# Patient Record
Sex: Male | Born: 1960
Health system: Southern US, Community
[De-identification: ages and names within clinical notes are randomized; demographics above are authoritative.]

## PROBLEM LIST (undated history)

## (undated) DIAGNOSIS — K439 Ventral hernia without obstruction or gangrene: Secondary | ICD-10-CM

## (undated) DIAGNOSIS — E785 Hyperlipidemia, unspecified: Secondary | ICD-10-CM

## (undated) DIAGNOSIS — I10 Essential (primary) hypertension: Secondary | ICD-10-CM

## (undated) DIAGNOSIS — F329 Major depressive disorder, single episode, unspecified: Secondary | ICD-10-CM

## (undated) DIAGNOSIS — F32A Depression, unspecified: Secondary | ICD-10-CM

## (undated) DIAGNOSIS — K219 Gastro-esophageal reflux disease without esophagitis: Secondary | ICD-10-CM

## (undated) DIAGNOSIS — G4733 Obstructive sleep apnea (adult) (pediatric): Secondary | ICD-10-CM

## (undated) DIAGNOSIS — E119 Type 2 diabetes mellitus without complications: Secondary | ICD-10-CM

## (undated) DIAGNOSIS — K2270 Barrett's esophagus without dysplasia: Secondary | ICD-10-CM

## (undated) DIAGNOSIS — E669 Obesity, unspecified: Secondary | ICD-10-CM

## (undated) HISTORY — DX: Gastro-esophageal reflux disease without esophagitis: K21.9

## (undated) HISTORY — DX: Type 2 diabetes mellitus without complications: E11.9

## (undated) HISTORY — DX: Major depressive disorder, single episode, unspecified: F32.9

## (undated) HISTORY — DX: Ventral hernia without obstruction or gangrene: K43.9

## (undated) HISTORY — DX: Barrett's esophagus without dysplasia: K22.70

## (undated) HISTORY — DX: Essential (primary) hypertension: I10

## (undated) HISTORY — DX: Obstructive sleep apnea (adult) (pediatric): G47.33

## (undated) HISTORY — PX: ESOPHAGOGASTRODUODENOSCOPY: SHX1529

## (undated) HISTORY — DX: Obesity, unspecified: E66.9

## (undated) HISTORY — DX: Depression, unspecified: F32.A

## (undated) HISTORY — DX: Hyperlipidemia, unspecified: E78.5

---

## 2003-02-17 ENCOUNTER — Emergency Department (HOSPITAL_COMMUNITY): Admission: EM | Admit: 2003-02-17 | Discharge: 2003-02-17 | Payer: Self-pay | Admitting: *Deleted

## 2005-07-27 ENCOUNTER — Ambulatory Visit (HOSPITAL_COMMUNITY): Admission: RE | Admit: 2005-07-27 | Discharge: 2005-07-27 | Payer: Self-pay | Admitting: Family Medicine

## 2007-12-20 ENCOUNTER — Ambulatory Visit: Admission: RE | Admit: 2007-12-20 | Discharge: 2007-12-20 | Payer: Self-pay | Admitting: Internal Medicine

## 2009-02-25 DIAGNOSIS — R0602 Shortness of breath: Secondary | ICD-10-CM | POA: Insufficient documentation

## 2009-02-25 DIAGNOSIS — G4733 Obstructive sleep apnea (adult) (pediatric): Secondary | ICD-10-CM | POA: Insufficient documentation

## 2009-02-25 DIAGNOSIS — E669 Obesity, unspecified: Secondary | ICD-10-CM | POA: Insufficient documentation

## 2009-02-25 DIAGNOSIS — K439 Ventral hernia without obstruction or gangrene: Secondary | ICD-10-CM | POA: Insufficient documentation

## 2009-02-25 DIAGNOSIS — E782 Mixed hyperlipidemia: Secondary | ICD-10-CM | POA: Insufficient documentation

## 2009-02-26 ENCOUNTER — Encounter: Payer: Self-pay | Admitting: Cardiology

## 2009-02-26 ENCOUNTER — Ambulatory Visit: Payer: Self-pay | Admitting: Cardiology

## 2009-03-22 ENCOUNTER — Ambulatory Visit: Payer: Self-pay | Admitting: Cardiology

## 2009-03-22 ENCOUNTER — Encounter (HOSPITAL_COMMUNITY): Admission: RE | Admit: 2009-03-22 | Discharge: 2009-04-21 | Payer: Self-pay | Admitting: Cardiology

## 2009-03-24 ENCOUNTER — Encounter: Payer: Self-pay | Admitting: Cardiology

## 2010-04-18 ENCOUNTER — Ambulatory Visit: Payer: Self-pay | Admitting: Orthopedic Surgery

## 2010-04-18 DIAGNOSIS — M2340 Loose body in knee, unspecified knee: Secondary | ICD-10-CM | POA: Insufficient documentation

## 2010-04-18 DIAGNOSIS — M25569 Pain in unspecified knee: Secondary | ICD-10-CM | POA: Insufficient documentation

## 2010-04-18 DIAGNOSIS — M2240 Chondromalacia patellae, unspecified knee: Secondary | ICD-10-CM | POA: Insufficient documentation

## 2010-04-26 ENCOUNTER — Encounter: Payer: Self-pay | Admitting: Orthopedic Surgery

## 2010-04-27 ENCOUNTER — Encounter: Payer: Self-pay | Admitting: Orthopedic Surgery

## 2010-04-29 ENCOUNTER — Ambulatory Visit (HOSPITAL_COMMUNITY): Admission: RE | Admit: 2010-04-29 | Discharge: 2010-04-29 | Payer: Self-pay | Admitting: Orthopedic Surgery

## 2010-09-20 NOTE — Letter (Signed)
Summary: Insurance MRI authorization  Insurance MRI authorization   Imported By: Jacklynn Ganong 04/27/2010 15:09:22  _____________________________________________________________________  External Attachment:    Type:   Image     Comment:   External Document

## 2010-09-20 NOTE — Letter (Signed)
Summary: MRI insurance approval  MRI insurance approval   Imported By: Jacklynn Ganong 04/28/2010 15:56:32  _____________________________________________________________________  External Attachment:    Type:   Image     Comment:   External Document

## 2010-09-20 NOTE — Assessment & Plan Note (Signed)
Summary: RT KNEE PAIN/NEEDS XRAY/BCBS OUT OF ST/TRICARE/CAF   Vital Signs:  Patient profile:   50 year old male Height:      71 inches Weight:      278 pounds Pulse rate:   78 / minute Resp:     16 per minute  Vitals Entered By: Fuller Canada MD (April 18, 2010 10:38 AM)  Visit Type:  Initial Consult Referring Provider:  Dr. Hughie Closs Primary Provider:  Dr. Hughie Closs  CC:  right knee pain.  History of Present Illness: 50 year old male with history of intermittent mild adult pain in the RIGHT knee for the last 6 months.  Pain seems to be behind the kneecap.  No catching or locking has been noted and there is no swelling although he does have some stiffness and weakness.  The pain is described as dull and worse after prolonged standing especially at the end of the day;  after 6-7 hours feels like a pebble under the knee cap and the knee gives out   back pain lower at times      Meds: Aciphex, Aspirin, Diovan HTZ, Cialis, Lovaza, Axiron, Simcor, Zyrtec, Zoloft, Mobic daily, Nasonex     Allergies (verified): No Known Drug Allergies  Past History:  Past Medical History: Current Problems:  HYPERLIPIDEMIA (ICD-272.4) SHORTNESS OF BREATH (ICD-786.05) OBESITY (ICD-278.00) VENTRAL HERNIA (ICD-553.20) OBSTRUCTIVE SLEEP APNEA (ICD-327.23) GERD low testosterone HTN  Past Surgical History: none  Family History: Family History of Diabetes  Social History: Patient is married.  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 Cardiovascular:  Complains of chest pain; denies palpitations, fainting, and murmurs. Respiratory:  Complains of short of breath and snoring; denies wheezing, couch, tightness, pain on inspiration, and snoring . Gastrointestinal:  Complains of heartburn; denies nausea, vomiting, diarrhea,  constipation, and blood in your stools. Genitourinary:  Denies frequency, urgency, difficulty urinating, painful urination, flank pain, and bleeding in urine. Neurologic:  Denies numbness, tingling, unsteady gait, dizziness, tremors, and seizure. Musculoskeletal:  Complains of joint pain and instability; denies swelling, stiffness, redness, heat, and muscle pain. Endocrine:  Denies excessive thirst, exessive urination, and heat or cold intolerance. Psychiatric:  Complains of depression; denies nervousness, anxiety, and hallucinations. Skin:  Denies changes in the skin, poor healing, rash, itching, and redness. HEENT:  Denies blurred or double vision, eye pain, redness, and watering. Immunology:  Complains of seasonal allergies; denies sinus problems and allergic to bee stings. Hemoatologic:  Denies easy bleeding and brusing.  Physical Exam  Additional Exam:  vitals are as recorded patient weighs 278 pounds he is 5 foot 11 his appearance was normal  He was oriented to time person and place he had a normal mood and affect.  His gait and station were normal.  His exam of both knees was not very impressive overall.  He has some medial joint line tenderness on the RIGHT none on the LEFT he had full range of motion on both knees had normal strength in both knees and the skin over both knees were normal  Pulse and temperature were normal in both legs without edema  View no lymphadenopathy in either groin sensation in both legs was normal and reflexes were equal.  Coordination bowels were normal.  His McMurray sign was equivocal.  The screw home  test was equivocal.   Impression & Recommendations:  Problem # 1:  LOOSE BODY-KNEE (ICD-717.6)  Orders: Consultation Level III ([address]) Knee x-ray,  3 views (60454) x-rays show mild arthritis in the RIGHT knee I do not see any loose body.  Recommend MRI based on the following pain has been present for 6 months, feeling of giving out it is noted.   Meniscal signs equivocal but history Sean for loose body.  Problem # 2:  KNEE PAIN (ICD-719.46)  His updated medication list for this problem includes:    Ecotrin 325 Mg Tbec (Aspirin) .[name] Kitchen... 1 tab once daily    Meloxicam 7.5 Mg Tabs (Meloxicam) .[name] Kitchen... As needed  Orders: Consultation Level III ([address]) Knee x-ray,  3 views (91478)  Patient Instructions: 1)  MRI right knee 2)  call with results

## 2010-09-20 NOTE — Letter (Signed)
Summary: History form  History form   Imported By: Jacklynn Ganong 04/28/2010 15:55:07  _____________________________________________________________________  External Attachment:    Type:   Image     Comment:   External Document

## 2010-09-20 NOTE — Letter (Signed)
Summary: *Orthopedic Consult Note  [facility]  [address] [place] [address] [address]  [place], [place] [address]   Phone: [phone]  Fax: [phone]    Re:    Derek Marsh DOB:    May 23, 1961   Dear: Timothy Lasso    Thank you for requesting that we see the above patient for consultation.  A copy of the detailed office note will be sent under separate cover, for your review.  Evaluation today is consistent with:  RIGHT knee pain withNo specific area of discomfort.  He does feel occasional pain and a light feeling behind his RIGHT knee After standing.  His pain however is more in the back of the knee after standing for 6-7 hours.  He does have some lower back pain which she is controlled with activity modification.  Our recommendation is for MRI to rule out loose body as a cause of discomfort in the knee and giving out symptoms.  The differential diagnoses however includes degenerative disc disease with dynamic leg weakness as a cause of giving out symptoms.   Thank you for this opportunity to look after your patient.  Sincerely,   Terrance Mass. MD.

## 2010-09-20 NOTE — Miscellaneous (Signed)
Summary: mri appt [facility] 04/29/10 reg at 12:30p  CALL WITH RESULTS  Clinical Lists Changes  precert for BCBS [id number] expires 07/25/10, Dr will call pt with results

## 2010-10-31 ENCOUNTER — Other Ambulatory Visit (HOSPITAL_COMMUNITY): Payer: Self-pay | Admitting: Internal Medicine

## 2010-10-31 ENCOUNTER — Ambulatory Visit (HOSPITAL_COMMUNITY)
Admission: RE | Admit: 2010-10-31 | Discharge: 2010-10-31 | Disposition: A | Discharge status: Home / Self Care | Payer: BC Managed Care – PPO | Source: Ambulatory Visit | Attending: Internal Medicine | Admitting: Internal Medicine

## 2010-10-31 DIAGNOSIS — R519 Headache, unspecified: Secondary | ICD-10-CM

## 2010-10-31 DIAGNOSIS — R51 Headache: Secondary | ICD-10-CM | POA: Insufficient documentation

## 2011-01-03 NOTE — Assessment & Plan Note (Signed)
[facility]                       [place] [facility]IOLOG[facility] OFFICE NOTE   SHAHEED, SCHMUCK                        MRN:          [id number]  DATE:02/26/2009                            DOB:          09-18-1960    I was asked by Catalina Pizza to consult on Derek Marsh with a chief  complaint of chest tightness and desire to get into an exercise program.   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  He quit smoking a  couple of years ago and has gained 40 pounds and really wants to get  back into an exercise program.  He seems very committed to this.   He does occasionally have chest tightness that is well localized to the  mid sternum with stress.  When he is on the job site and climbing steps  or doing other exertional activity, he does not have chest tightness.  He does have some mild dyspnea on exertion, however.   He has a family history of coronary disease with his father having a  stent at age 21.  His father smoked, however.   Derek Marsh does have hyperlipidemia and is being treated with Simcor.  He  also smoked up until 2 years ago.  There is no history of hypertension  or diabetes.   His medications are currently:  1. Lovaza 1 g t.i.d.  2. Ecotrin 325 a day.  3. Nasonex 50 mcg 2 sprays in each nostril daily.  4. Allergy relief 10 mg daily.  5. Simcor 1000/20 mg at bedtime.  6. Aciphex 20 mg a day.  7. AndroGel pump.  8. He takes Viagra and meloxicam p.r.n.   He has no known drug allergies.   He does not use alcohol or drugs.  He has quit smoking.   SOCIAL HISTORY:  He works for the [employer].  He is currently working in [place], building a hospital.  He is  married and has 3 children.   Family history is outlined above.  As I told him today, his father did  have premature coronary disease and did smoke.   REVIEW OF SYSTEMS:  He  has occasional headaches.  He wears glasses.  His  teeth are in good shape with no cavities or dentures.  He does have a  history of gastroesophageal reflux, and he has some occasional swelling  in his feet and his legs.  He denies any orthopnea or PND, otherwise.   PHYSICAL EXAMINATION:  GENERAL:  He is a very pleasant gentleman in no  acute distress.  VITAL SIGNS:  He is 5 feet 10 inches, weighs 288.  His blood pressure is  137/87, his pulse is 90 and regular.  His EKG shows sinus rhythm with  nonspecific changes.  HEENT:  Normal.  NECK:  Supple.  Carotid upstrokes were equal bilaterally without bruits.  Thyroid is not enlarged.  Trachea is midline.  No lymphadenopathy.  CHEST:  Lungs to be clear to auscultation and percussion.  Heart reveals  a poorly appreciated PMI.  He is a very muscular male.  Soft S1 and S2.  S2 splits.  ABDOMEN:  Slightly protuberant.  No midline or flank bruit.  No obvious  organomegaly or tenderness.  EXTREMITIES:  There were no cyanosis, clubbing, or edema.  Pulses are  intact.  NEUROLOGIC:  Intact.  MUSCULOSKELETAL:  Intact.  SKIN:  Intact and normal.   ASSESSMENT:  1. Dyspnea on exertion with multiple cardiac risk factors, rule out      obstructive coronary artery disease.  2. Chest tightness with stress which is probably not cardiac.  3. Hyperlipidemia.  4. Remote tobacco.  5. Family history though not premature of coronary disease in his      family.   PLAN:  Exercise rest stress Myoview.  If this is negative for ischemia.  He will be released to exercise 3 hours per week or more.  I have made  it clear to him he will not lose weight unless he change his diet with  exercise alone.  He understands this concept.     Thomas C. Daleen Squibb, MD, [facility]  Electronically Signed    TCW/MedQ  DD: 02/26/2009  DT: 02/27/2009  Job #: [id number]   cc:   Catalina Pizza, M.D.

## 2011-01-06 NOTE — Procedures (Signed)
NAMEDAMEON, SOLTIS                 ACCOUNT NO.:  [id number]   MEDICAL RECORD NO.:  [id number]          PATIENT TYPE:  OUT   LOCATION:  SLEE                          FACILITY:  [facility]   PHYSICIAN:  Kofi A. Gerilyn Pilgrim, M.D. DATE OF BIRTH:  June 30, 1961   DATE OF PROCEDURE:  DATE OF DISCHARGE:  12/20/2007                             SLEEP DISORDER REPORT   NOCTURNAL POLYSOMNOGRAPHY REPORT.   REFERRING PHYSICIAN:   INDICATION:  This is 50 year old who presents with hypersomnia and  snoring, and is being evaluated for obstructive sleep apnea syndrome.  BMI 36 and Epworth sleepiness scale of 12.   MEDICATIONS:  1. Zyrtec.  2. Aciphex.  3. Mobic.  4. Wellbutrin.  5. Aspirin.  6. Testosterone.  7. Simcor.  8. Lovaza.   SLEEP STAGE SUMMARY:  The total recording time is 450 minutes.  The  overall sleep efficiency is 79%, sleep latency 34 minutes, and REM  latency 260 minutes.  This was a split night study.   RESPIRATORY SUMMARY:  This was a split night study with the initial part  being diagnostic potion and the second half being titration portion.  The baseline oxygen saturation is 95%, the lowest oxygen saturation is  89%, and the diagnostic AHI is 20.  The patient subsequently titrated  between a pressure of 5 and 10 with an optimal pressure of 9.  He  appears to have developed central events on a pressure of 10.  He  tolerated the CPAP well.   LEG MOVEMENT SUMMARY:  No nocturnal leg movements observed.   ELECTROCARDIOGRAM SUMMARY:  Average heart rate at 67% with isolated PVCs  observed.   IMPRESSION:  Moderate obstructive sleep apnea syndrome which responded  well to a continuous positive airway pressure of 9.   Thanks for this referral.      Kofi A. Gerilyn Pilgrim, M.D.  Electronically Signed     KAD/MEDQ  D:  12/28/2007  T:  12/29/2007  Job:  [id number]

## 2011-07-17 ENCOUNTER — Encounter: Payer: Self-pay | Admitting: Cardiology

## 2011-10-19 ENCOUNTER — Telehealth: Payer: Self-pay

## 2011-10-19 ENCOUNTER — Other Ambulatory Visit: Payer: Self-pay

## 2011-10-19 DIAGNOSIS — Z139 Encounter for screening, unspecified: Secondary | ICD-10-CM

## 2011-10-19 NOTE — Telephone Encounter (Signed)
Gastroenterology Pre-Procedure Form   Request Date: 10/19/2011      Requesting Physician: Dr. Dwana Melena     PATIENT INFORMATION:  Derek Marsh is a 51 y.o., male (DOB=10-18-1960).  PROCEDURE: Procedure(s) requested: colonoscopy Procedure Reason: screening for colon cancer  PATIENT REVIEW QUESTIONS: The patient reports the following:   1. Diabetes Melitis: yes Diet controlled at this time 2. Joint replacements in the past 12 months: no 3. Major health problems in the past 3 months: no 4. Has an artificial valve or MVP:no 5. Has been advised in past to take antibiotics in advance of a procedure like teeth cleaning: no    MEDICATIONS & ALLERGIES:    Patient reports the following regarding taking any blood thinners:   Plavix? no Aspirin?yes  Coumadin?  no  Patient confirms/reports the following medications:  Current Outpatient Prescriptions  Medication Sig Dispense Refill  . aspirin 325 MG EC tablet Take 325 mg by mouth daily.        . cetirizine (ZYRTEC) 10 MG tablet Take 10 mg by mouth daily.        [name] Kitchen desvenlafaxine (PRISTIQ) 50 MG 24 hr tablet Take 50 mg by mouth daily.      . Glucosamine-Chondroitin (GLUCOSAMINE CHONDR COMPLEX PO) Take by mouth.      . meloxicam (MOBIC) 7.5 MG tablet Take 7.5 mg by mouth as needed.        . mometasone (NASONEX) 50 MCG/ACT nasal spray Place 2 sprays into the nose daily.        . Multiple Vitamins-Minerals (CENTRUM SILVER PO) Take by mouth.      . niacin-simvastatin (SIMCOR) 1000-20 MG 24 hr tablet Take 1 tablet by mouth at bedtime.        . NON FORMULARY Calcium 600 mg plus D 400 IU      . omega-3 acid ethyl esters (LOVAZA) 1 G capsule Take 1 g by mouth 3 (three) times daily.        . RABEprazole (ACIPHEX) 20 MG tablet Take 20 mg by mouth daily.        . sildenafil (VIAGRA) 100 MG tablet Take 100 mg by mouth as needed.        . testosterone (ANDROGEL) 50 MG/5GM GEL Place 10 g onto the skin daily.        . valsartan-hydrochlorothiazide  (DIOVAN-HCT) 80-12.5 MG per tablet Take 1 tablet by mouth daily.        Patient confirms/reports the following allergies:  No Known Allergies  Patient is appropriate to schedule for requested procedure(s): yes  AUTHORIZATION INFORMATION Primary Insurance:   ID #:   Group #:  Pre-Cert / Auth required: Pre-Cert / Auth #:   Secondary Insurance:   ID #:   Group #:  Pre-Cert / Auth required:  Pre-Cert / Auth #:   No orders of the defined types were placed in this encounter.    SCHEDULE INFORMATION: Procedure has been scheduled as follows:  Date: 11/13/2011     Time: 9:15 AM  Location: [facility] Short Stay  This Gastroenterology Pre-Precedure Form is being routed to the following provider(s) for review: R. Roetta Sessions, MD

## 2011-10-20 NOTE — Telephone Encounter (Signed)
OK as is.

## 2011-11-06 ENCOUNTER — Encounter (HOSPITAL_COMMUNITY): Payer: Self-pay | Admitting: Pharmacy Technician

## 2011-11-08 ENCOUNTER — Telehealth: Payer: Self-pay

## 2011-11-08 NOTE — Telephone Encounter (Signed)
Pt's wife call and said the Rx and instructions had not been received. I faxed to [facility] x 2. They said they never received it. They are having problems with their fax machine. I called order in for the trilyte prep. Called pt and he will have wife pick up the instructions. They are at the front.

## 2011-11-10 MED ORDER — SODIUM CHLORIDE 0.45 % IV SOLN
Freq: Once | INTRAVENOUS | Status: AC
  Administered 2011-11-13: 08:00:00 via INTRAVENOUS

## 2011-11-13 ENCOUNTER — Encounter (HOSPITAL_COMMUNITY)
Admission: RE | Disposition: A | Discharge status: Home / Self Care | Payer: Self-pay | Source: Ambulatory Visit | Attending: Internal Medicine

## 2011-11-13 ENCOUNTER — Encounter (HOSPITAL_COMMUNITY): Payer: Self-pay | Admitting: *Deleted

## 2011-11-13 ENCOUNTER — Ambulatory Visit (HOSPITAL_COMMUNITY)
Admission: RE | Admit: 2011-11-13 | Discharge: 2011-11-13 | Disposition: A | Discharge status: Home / Self Care | Payer: BC Managed Care – PPO | Source: Ambulatory Visit | Attending: Internal Medicine | Admitting: Internal Medicine

## 2011-11-13 DIAGNOSIS — Z1211 Encounter for screening for malignant neoplasm of colon: Secondary | ICD-10-CM

## 2011-11-13 DIAGNOSIS — Z79899 Other long term (current) drug therapy: Secondary | ICD-10-CM | POA: Insufficient documentation

## 2011-11-13 DIAGNOSIS — K5730 Diverticulosis of large intestine without perforation or abscess without bleeding: Secondary | ICD-10-CM | POA: Insufficient documentation

## 2011-11-13 DIAGNOSIS — K644 Residual hemorrhoidal skin tags: Secondary | ICD-10-CM | POA: Insufficient documentation

## 2011-11-13 DIAGNOSIS — Z139 Encounter for screening, unspecified: Secondary | ICD-10-CM

## 2011-11-13 DIAGNOSIS — E785 Hyperlipidemia, unspecified: Secondary | ICD-10-CM | POA: Insufficient documentation

## 2011-11-13 DIAGNOSIS — G4733 Obstructive sleep apnea (adult) (pediatric): Secondary | ICD-10-CM | POA: Insufficient documentation

## 2011-11-13 DIAGNOSIS — I10 Essential (primary) hypertension: Secondary | ICD-10-CM | POA: Insufficient documentation

## 2011-11-13 HISTORY — PX: COLONOSCOPY: SHX5424

## 2011-11-13 SURGERY — COLONOSCOPY
Anesthesia: Moderate Sedation

## 2011-11-13 MED ORDER — MEPERIDINE HCL 100 MG/ML IJ SOLN
INTRAMUSCULAR | Status: DC | PRN
  Administered 2011-11-13: 25 mg via INTRAVENOUS
  Administered 2011-11-13: 50 mg via INTRAVENOUS

## 2011-11-13 MED ORDER — MEPERIDINE HCL 100 MG/ML IJ SOLN
INTRAMUSCULAR | Status: AC
  Filled 2011-11-13: qty 2

## 2011-11-13 MED ORDER — STERILE WATER FOR IRRIGATION IR SOLN
Status: DC | PRN
  Administered 2011-11-13: 09:00:00

## 2011-11-13 MED ORDER — MIDAZOLAM HCL 5 MG/5ML IJ SOLN
INTRAMUSCULAR | Status: AC
  Filled 2011-11-13: qty 10

## 2011-11-13 MED ORDER — MIDAZOLAM HCL 5 MG/5ML IJ SOLN
INTRAMUSCULAR | Status: DC | PRN
  Administered 2011-11-13: 1 mg via INTRAVENOUS
  Administered 2011-11-13 (×2): 2 mg via INTRAVENOUS
  Administered 2011-11-13: 1 mg via INTRAVENOUS

## 2011-11-13 NOTE — Op Note (Signed)
[facility] [address] [place], [place]  [address]  COLONOSCOPY PROCEDURE REPORT  PATIENT:  Derek Marsh, Derek Marsh  MR#:  [id number] BIRTHDATE:  20-Jan-1961, 51 yrs. old  GENDER:  male ENDOSCOPIST:  R. Roetta Sessions, MD FACP [facility] REF. BY:  Catalina Pizza, M.D. PROCEDURE DATE:  11/13/2011 PROCEDURE:  Screening colonoscopy  INDICATIONS:  First ever average risk screening examination  INFORMED CONSENT:  The risks, benefits, alternatives and imponderables including but not limited to bleeding, perforation as well as the possibility of a missed lesion have been reviewed. The potential for biopsy, lesion removal, etc. have also been discussed.  Questions have been answered.  All parties agreeable. Please see the history and physical in the medical record for more information.  MEDICATIONS:  Versed 6 mg IV and Demerol 75 mg IV in divided doses.  DESCRIPTION OF PROCEDURE:  After a digital rectal exam was performed, the EC-[id number] ([id number]) colonoscope was advanced from the anus through the rectum and colon to the area of the cecum, ileocecal valve and appendiceal orifice.  The cecum was deeply intubated.  These structures were well-seen and photographed for the record.  From the level of the cecum and ileocecal valve, the scope was slowly and cautiously withdrawn.  The mucosal surfaces were carefully surveyed utilizing scope tip deflection to facilitate fold flattening as needed.  The scope was pulled down into the rectum where a thorough examination including retroflexion was performed. <<PROCEDUREIMAGES>>  FINDINGS: Suboptimal preparation. External hemorrhoids. Normal rectum. Pancolonic diverticulosis; remainder of colonic mucosa appeared normal.  THERAPEUTIC / DIAGNOSTIC MANEUVERS PERFORMED: None  COMPLICATIONS:  None  CECAL WITHDRAWAL TIME: 13 minutes  IMPRESSION: Colonic diverticulosis and external hemorrhoids.  RECOMMENDATIONS: Repeat screening colonoscopy in 10  years  ______________________________ R. Roetta Sessions, MD C[facility]  CC:  Catalina Pizza, M.D.  n. eSIGNED:   R. Roetta Sessions at 11/13/2011 09:12 AM  Lawerance Sabal, [phone]

## 2011-11-13 NOTE — H&P (Addendum)
Primary Care Physician:  Dwana Melena, MD, MD Primary Gastroenterologist:  Dr. Jena Gauss  Pre-Procedure History & Physical: HPI:  Derek Marsh is a 51 y.o. male is here for a screening colonoscopy.  No prior colonoscopy. No bowel symptoms. No family history colon cancer. Mother had polyps and advanced age.  Past Medical History  Diagnosis Date  . Hyperlipidemia   . SOB (shortness of breath)   . Obesity   . Ventral hernia   . OSA (obstructive sleep apnea)   . GERD (gastroesophageal reflux disease)   . Low testosterone   . Hypertension     Past Surgical History  Procedure Date  . Esophagogastroduodenoscopy     Prior to Admission medications   Medication Sig Start Date End Date Taking? Authorizing Provider  calcium-vitamin D (OSCAL WITH D) 500-200 MG-UNIT per tablet Take 1 tablet by mouth daily.   Yes Historical Provider, MD  metFORMIN (GLUCOPHAGE) 500 MG tablet Take 500 mg by mouth 2 (two) times daily.   Yes Historical Provider, MD  Multiple Vitamin (MULITIVITAMIN WITH MINERALS) TABS Take 1 tablet by mouth daily.   Yes Historical Provider, MD  aspirin 325 MG EC tablet Take 325 mg by mouth daily.      Historical Provider, MD  cetirizine (ZYRTEC) 10 MG tablet Take 10 mg by mouth daily.      Historical Provider, MD  desvenlafaxine (PRISTIQ) 50 MG 24 hr tablet Take 50 mg by mouth daily.    Historical Provider, MD  Glucosamine-Chondroitin (GLUCOSAMINE CHONDR COMPLEX PO) Take 1 tablet by mouth daily.     Historical Provider, MD  meloxicam (MOBIC) 7.5 MG tablet Take 7.5 mg by mouth as needed. For pain    Historical Provider, MD  mometasone (NASONEX) 50 MCG/ACT nasal spray Place 2 sprays into the nose daily.      Historical Provider, MD  niacin-simvastatin [facility]) 1000-20 MG 24 hr tablet Take 1 tablet by mouth at bedtime.      Historical Provider, MD  omega-3 acid ethyl esters (LOVAZA) 1 G capsule Take 3 g by mouth daily.     Historical Provider, MD  RABEprazole (ACIPHEX) 20 MG tablet Take 20  mg by mouth daily.      Historical Provider, MD  sildenafil (VIAGRA) 100 MG tablet Take 100 mg by mouth as needed. For activity    Historical Provider, MD  testosterone (ANDROGEL) 50 MG/5GM GEL Place 10 g onto the skin daily.      Historical Provider, MD  valsartan-hydrochlorothiazide (DIOVAN-HCT) 80-12.5 MG per tablet Take 1 tablet by mouth daily.    Historical Provider, MD    Allergies as of 10/19/2011  . (No Known Allergies)    Family History  Problem Relation Age of Onset  . Diabetes Other   . Colon cancer Neg Hx     History   Social History  . Marital Status: Married    Spouse Name: N/A    Number of Children: N/A  . Years of Education: N/A   Occupational History  . Programmer, multimedia    Social History Main Topics  . Smoking status: Former Smoker -- 2.0 packs/day for 15 years  . Smokeless tobacco: Not on file  . Alcohol Use: Yes     occasionally  . Drug Use: No  . Sexually Active: Not on file   Other Topics Concern  . 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 Eyes:  Sclera clear, no icterus.   Conjunctiva pink. Ears:  Normal auditory acuity. Nose:  No deformity, discharge,  or lesions. Mouth:  No deformity or lesions, dentition normal. Neck:  Supple; no masses or thyromegaly. Lungs:  Clear throughout to auscultation.   No wheezes, crackles, or rhonchi. No acute distress. Heart:  Regular rate and rhythm; no murmurs, clicks, rubs,  or gallops. Abdomen:  Obese Soft, nontender and nondistended. No masses, hepatosplenomegaly or hernias noted. Normal bowel sounds, without guarding, and without  rebound.   Msk:  Symmetrical without gross deformities. Normal posture. Pulses:  Normal pulses noted. Extremities:  Without clubbing or edema. Neurologic:  Alert and  oriented x4;  grossly normal neurologically. Skin:  Intact without significant lesions or rashes. Cervical Nodes:  No significant cervical adenopathy. Psych:  Alert and cooperative. Normal mood and affect.  Impression/Plan: [name] is now here to undergo a screening colonoscopy.  First-ever average risk screening examination.  The risks, benefits, limitations, imponderables and alternatives regarding colonoscopy have been reviewed with the patient. Questions have been answered. All parties agreeable.

## 2011-11-13 NOTE — Discharge Instructions (Addendum)
Colonoscopy Discharge Instructions  Read the instructions outlined below and refer to this sheet in the next few weeks. These discharge instructions provide you with general information on caring for yourself after you leave the hospital. Your doctor may also give you specific instructions. While your treatment has been planned according to the most current medical practices available, unavoidable complications occasionally occur. If you have any problems or questions after discharge, call Dr. Jena Gauss at [phone]. ACTIVITY  You may resume your regular activity, but move at a slower pace for the next 24 hours.   Take frequent rest periods for the next 24 hours.   Walking will help get rid of the air and reduce the bloated feeling in your belly (abdomen).   No driving for 24 hours (because of the medicine (anesthesia) used during the test).    Do not sign any important legal documents or operate any machinery for 24 hours (because of the anesthesia used during the test).  NUTRITION  Drink plenty of fluids.   You may resume your normal diet as instructed by your doctor.   Begin with a light meal and progress to your normal diet. Heavy or fried foods are harder to digest and may make you feel sick to your stomach (nauseated).   Avoid alcoholic beverages for 24 hours or as instructed.  MEDICATIONS  You may resume your normal medications unless your doctor tells you otherwise.  WHAT YOU CAN EXPECT TODAY  Some feelings of bloating in the abdomen.   Passage of more gas than usual.   Spotting of blood in your stool or on the toilet paper.  IF YOU HAD POLYPS REMOVED DURING THE COLONOSCOPY:  No aspirin products for 7 days or as instructed.   No alcohol for 7 days or as instructed.   Eat a soft diet for the next 24 hours.  FINDING OUT THE RESULTS OF YOUR TEST Not all test results are available during your visit. If your test results are not back during the visit, make an appointment  with your caregiver to find out the results. Do not assume everything is normal if you have not heard from your caregiver or the medical facility. It is important for you to follow up on all of your test results.  SEEK IMMEDIATE MEDICAL ATTENTION IF:  You have more than a spotting of blood in your stool.   Your belly is swollen (abdominal distention).   You are nauseated or vomiting.   You have a temperature over 101.   You have abdominal pain or discomfort that is severe or gets worse throughout the day.    Diverticulosis information provided.  Screening colonoscopy in 10 yearsDiverticulitis A diverticulum is a small pouch or sac on the colon. Diverticulosis is the presence of these diverticula on the colon. Diverticulitis is the irritation (inflammation) or infection of diverticula. CAUSES  The colon and its diverticula contain bacteria. If food particles block the tiny opening to a diverticulum, the bacteria inside can grow and cause an increase in pressure. This leads to infection and inflammation and is called diverticulitis. SYMPTOMS   Abdominal pain and tenderness. Usually, the pain is located on the left side of your abdomen. However, it could be located elsewhere.   Fever.   Bloating.   Feeling sick to your stomach (nausea).   Throwing up (vomiting).   Abnormal stools.  DIAGNOSIS  Your caregiver will take a history and perform a physical exam. Since many things can cause abdominal pain, other tests  may be necessary. Tests may include:  Blood tests.   Urine tests.   X-ray of the abdomen.   CT scan of the abdomen.  Sometimes, surgery is needed to determine if diverticulitis or other conditions are causing your symptoms. TREATMENT  Most of the time, you can be treated without surgery. Treatment includes:  Resting the bowels by only having liquids for a few days. As you improve, you will need to eat a low-fiber diet.   Intravenous (IV) fluids if you are losing  body fluids (dehydrated).   Antibiotic medicines that treat infections may be given.   Pain and nausea medicine, if needed.   Surgery if the inflamed diverticulum has burst.  HOME CARE INSTRUCTIONS   Try a clear liquid diet (broth, tea, or water for as long as directed by your caregiver). You may then gradually begin a low-fiber diet as tolerated. A low-fiber diet is a diet with less than 10 grams of fiber. Choose the foods below to reduce fiber in the diet:   White breads, cereals, rice, and pasta.   Cooked fruits and vegetables or soft fresh fruits and vegetables without the skin.   Ground or well-cooked tender beef, ham, veal, lamb, pork, or poultry.   Eggs and seafood.   After your diverticulitis symptoms have improved, your caregiver may put you on a high-fiber diet. A high-fiber diet includes 14 grams of fiber for every 1000 calories consumed. For a standard 2000 calorie diet, you would need 28 grams of fiber. Follow these diet guidelines to help you increase the fiber in your diet. It is important to slowly increase the amount fiber in your diet to avoid gas, constipation, and bloating.   Choose whole-grain breads, cereals, pasta, and brown rice.   Choose fresh fruits and vegetables with the skin on. Do not overcook vegetables because the more vegetables are cooked, the more fiber is lost.   Choose more nuts, seeds, legumes, dried peas, beans, and lentils.   Look for food products that have greater than 3 grams of fiber per serving on the Nutrition Facts label.   Take all medicine as directed by your caregiver.   If your caregiver has given you a follow-up appointment, it is very important that you go. Not going could result in lasting (chronic) or permanent injury, pain, and disability. If there is any problem keeping the appointment, call to reschedule.  SEEK MEDICAL CARE IF:   Your pain does not improve.   You have a hard time advancing your diet beyond clear liquids.     Your bowel movements do not return to normal.  SEEK IMMEDIATE MEDICAL CARE IF:   Your pain becomes worse.   You have an oral temperature above 102 F (38.9 C), not controlled by medicine.   You have repeated vomiting.   You have bloody or black, tarry stools.   Symptoms that brought you to your caregiver become worse or are not getting better.  MAKE SURE YOU:   Understand these instructions.   Will watch your condition.   Will get help right away if you are not doing well or get worse.  Document Released: 05/17/2005 Document Revised: 07/27/2011 Document Reviewed: 09/12/2010 [facility] Patient Information 2012 [place], [place].

## 2011-11-15 ENCOUNTER — Encounter (HOSPITAL_COMMUNITY): Payer: Self-pay | Admitting: Internal Medicine

## 2013-12-04 ENCOUNTER — Ambulatory Visit (INDEPENDENT_AMBULATORY_CARE_PROVIDER_SITE_OTHER): Payer: BC Managed Care – PPO | Admitting: Orthopedic Surgery

## 2013-12-04 ENCOUNTER — Encounter: Payer: Self-pay | Admitting: Orthopedic Surgery

## 2013-12-04 ENCOUNTER — Ambulatory Visit (INDEPENDENT_AMBULATORY_CARE_PROVIDER_SITE_OTHER): Payer: BC Managed Care – PPO

## 2013-12-04 VITALS — BP 118/71 | Ht 70.0 in | Wt 302.0 lb

## 2013-12-04 DIAGNOSIS — M79609 Pain in unspecified limb: Secondary | ICD-10-CM

## 2013-12-04 DIAGNOSIS — M79673 Pain in unspecified foot: Secondary | ICD-10-CM

## 2013-12-04 NOTE — Patient Instructions (Signed)
You have received a steroid shot. 15% of patients experience increased pain at the injection site with in the next 24 hours. This is best treated with ice and tylenol extra strength 2 tabs every 8 hours. If you are still having pain please call the office.   Continue exercises and ice

## 2013-12-04 NOTE — Progress Notes (Signed)
Patient ID: Derek GandyAlvin W Polcyn, male   DOB: 12/13/1960, 53 y.o.   MRN: [id number]  Chief Complaint  Patient presents with  . Foot Pain    Left heel pain, no injury    53 year old male with a history of pain in his left foot since February of this year. He complains of plantar pain. He had pain similar to his right foot and treated himself on the left side with stretching and ice. Pain is noted on the first step in the morning and then progressively decreases and then increases again at the end of the day. Pain is constant 7/10 sometimes sharp sometimes dull.  System review heartburn joint pain seasonal allergies otherwise normal  No allergies  Medical problems type 2 diabetes, diverticulosis, GERD, hypertension, seasonal allergies, sleep apnea. Heart disease noted on family history as well as diabetes  He's married he's a Programmer, multimediaconstruction superintendent he does not smoke he has maybe one beer a week  Meds include omega-3's, multivitamin, baby aspirin, glucosamine supplement, Mobic, AcipHex,pristiq, valsartan with hydrochlorothiazide Zyrtec diclofenac metformin Cialis Nasonex the toes and testosterone gel  BP 118/71  Ht 5\' 10"  (1.778 m)  Wt 302 lb (136.986 kg)  BMI 43.33 kg/m2 General appearance is normal, the patient is alert and oriented x3 with normal mood and affect. Gait no disturbance  The foot alignment is normal normal range of motion at the ankle ankle stable muscle tone normal skin intact good pulse normal sensation tenderness in the heel but it's mild.  Suspect plantar fasciitis  Recommend continue exercises and I will perform injection of the heel  Procedure injection plantar fascia  Verbal consent was obtained   Time out completed   The left foot was injected  Under sterile conditions the plantar fascia  was injected with Depomedrol 40 mg / ml (1 ml) and lidocaine 1% (4 ml)  There were no complications

## 2014-01-06 ENCOUNTER — Ambulatory Visit: Payer: BC Managed Care – PPO | Admitting: Orthopedic Surgery

## 2014-01-22 ENCOUNTER — Encounter: Payer: Self-pay | Admitting: Orthopedic Surgery

## 2014-01-22 ENCOUNTER — Ambulatory Visit (INDEPENDENT_AMBULATORY_CARE_PROVIDER_SITE_OTHER): Payer: BC Managed Care – PPO | Admitting: Orthopedic Surgery

## 2014-01-22 DIAGNOSIS — M79609 Pain in unspecified limb: Secondary | ICD-10-CM

## 2014-01-22 DIAGNOSIS — M79673 Pain in unspecified foot: Secondary | ICD-10-CM

## 2014-01-22 NOTE — Patient Instructions (Signed)
Take mobic continue  Stretching for the achilles

## 2014-01-22 NOTE — Progress Notes (Signed)
Patient ID: Derek Marsh, male   DOB: 1960/12/14, 53 y.o.   MRN: [id number]  Recheck left plantar fasciitis.  Patient had injection injection produced symptoms does have some mild Achilles pain at the end of the night he stands on his feet all day during his work boots he has a good orthotic has no tenderness in the plantar fascia mild tenderness in the Achilles. Good range of motion in his foot  Recommend continue Mobic, orthotic, stretching. Come back for injection if needed

## 2014-04-29 ENCOUNTER — Other Ambulatory Visit (HOSPITAL_COMMUNITY): Payer: Self-pay | Admitting: Internal Medicine

## 2014-04-29 ENCOUNTER — Ambulatory Visit (HOSPITAL_COMMUNITY)
Admission: RE | Admit: 2014-04-29 | Discharge: 2014-04-29 | Disposition: A | Discharge status: Home / Self Care | Payer: BC Managed Care – PPO | Source: Ambulatory Visit | Attending: Internal Medicine | Admitting: Internal Medicine

## 2014-04-29 DIAGNOSIS — N508 Other specified disorders of male genital organs: Secondary | ICD-10-CM | POA: Diagnosis not present

## 2014-04-29 DIAGNOSIS — N5089 Other specified disorders of the male genital organs: Secondary | ICD-10-CM

## 2014-09-03 ENCOUNTER — Encounter: Payer: Self-pay | Admitting: Cardiology

## 2014-09-04 ENCOUNTER — Encounter: Payer: Self-pay | Admitting: Cardiology

## 2014-09-04 ENCOUNTER — Ambulatory Visit (INDEPENDENT_AMBULATORY_CARE_PROVIDER_SITE_OTHER): Payer: BLUE CROSS/BLUE SHIELD | Admitting: Cardiology

## 2014-09-04 VITALS — BP 94/70 | HR 90 | Ht 70.0 in | Wt 295.0 lb

## 2014-09-04 DIAGNOSIS — I10 Essential (primary) hypertension: Secondary | ICD-10-CM | POA: Diagnosis not present

## 2014-09-04 DIAGNOSIS — E782 Mixed hyperlipidemia: Secondary | ICD-10-CM

## 2014-09-04 DIAGNOSIS — R072 Precordial pain: Secondary | ICD-10-CM | POA: Diagnosis not present

## 2014-09-04 DIAGNOSIS — Z136 Encounter for screening for cardiovascular disorders: Secondary | ICD-10-CM | POA: Diagnosis not present

## 2014-09-04 DIAGNOSIS — E119 Type 2 diabetes mellitus without complications: Secondary | ICD-10-CM | POA: Insufficient documentation

## 2014-09-04 NOTE — Assessment & Plan Note (Signed)
Followed by Dr. Margo AyeHall on oral therapies detailed above. Recent hemoglobin A1c 6.2.

## 2014-09-04 NOTE — Progress Notes (Signed)
Reason for visit: Cardiac evaluation, chest pain  Clinical Summary Mr. Fosco is a 54 y.o.male referred for cardiology consultation by Dr. Margo AyeHall. He is here with his wife today. Medical history is outlined below. 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 He has gained a lot of weight over the last several years, retired from [employer] in 2004. He is interested in starting back on a regular exercise plan. He reports NYHA class II dyspnea, sometimes when he gets "stressed" he does have a left-sided chest discomfort that is somewhat achy, usually mild intensity, last for a few minutes. He is not aware of any personal history of CAD or myocardial infarction. ECG today shows sinus rhythm with poor R-wave progression, cannot rule out old inferior infarct pattern, borderline low voltage.  Record review finds prior evaluation by Dr. Daleen SquibbWall back in 2010. Exercise Myoview at that time showed no diagnostic ST segment changes at 9.6 METs, hypertensive response, no evidence of ischemia with LVEF 57%.  Lab work from December 2015 showed potassium 4.4, BUN 17, creatinine 0.9, AST 32, ALT 45, hemoglobin 15.4, platelets 217, cholesterol 127, triglycerides 261, HDL 27, LDL 48, hemoglobin A1c 6.2. He reports compliance with his medications.  Also history of obstructive sleep apnea, he uses CPAP nightly.   No Known Allergies  Current Outpatient Prescriptions  Medication Sig Dispense Refill  . aspirin 325 MG EC tablet Take 325 mg by mouth daily.      . celecoxib (CELEBREX) 200 MG capsule Take 200 mg by mouth daily.    . cetirizine (ZYRTEC) 10 MG tablet Take 10 mg by mouth daily.      . Dapagliflozin-Metformin HCl ER 5-500 MG TB24 Take 5 mg by mouth 2 (two) times daily.    [name] Kitchen. desvenlafaxine (PRISTIQ) 50 MG 24 hr tablet Take 50 mg by mouth daily.    . Glucosamine-Chondroitin (GLUCOSAMINE CHONDR COMPLEX PO) Take 1 tablet by mouth daily.     . Liraglutide 18 MG/3ML SOPN Inject 1.8 mLs into the  skin daily.    . mometasone (NASONEX) 50 MCG/ACT nasal spray Place 2 sprays into the nose daily.      . Multiple Vitamin (MULITIVITAMIN WITH MINERALS) TABS Take 1 tablet by mouth daily.    . niacin-simvastatin (SIMCOR) 1000-20 MG 24 hr tablet Take 1 tablet by mouth at bedtime.      [name] Kitchen. omega-3 acid ethyl esters (LOVAZA) 1 G capsule Take 3 g by mouth daily.     . RABEprazole (ACIPHEX) 20 MG tablet Take 20 mg by mouth daily.      . tadalafil (CIALIS) 20 MG tablet Take 20 mg by mouth daily as needed for erectile dysfunction.    . valsartan-hydrochlorothiazide (DIOVAN-HCT) 80-12.5 MG per tablet Take 1 tablet by mouth daily.     No current facility-administered medications for this visit.    Past Medical History  Diagnosis Date  . Hyperlipidemia   . Obesity   . Ventral hernia   . OSA (obstructive sleep apnea)   . GERD (gastroesophageal reflux disease)   . Low testosterone   . Essential hypertension   . Depression   . Type 2 diabetes mellitus     Past Surgical History  Procedure Laterality Date  . Esophagogastroduodenoscopy    . Colonoscopy  11/13/2011    Procedure: COLONOSCOPY;  Surgeon: Corbin Adeobert M Rourk, MD;  Location: [facility];  Service: Endoscopy;  Laterality: N/A;  9:15 AM    Family History  Problem Relation Age of  Onset  . Diabetes Other   . Colon cancer Neg Hx     Social History Mr. Lyssy reports that he quit smoking about 7 years ago. His smoking use included Cigarettes. He started smoking about 37 years ago. He has a 30 pack-year smoking history. He has never used smokeless tobacco. Mr. Mckellips reports that he drinks alcohol.  Review of Systems Complete review of systems negative except as otherwise outlined in the clinical summary and also the following.  Physical Examination Filed Vitals:   09/04/14 1346  BP: 94/70  Pulse: 90   Filed Weights   09/04/14 1330  Weight: 295 lb (133.811 kg)   Morbidly obese male, appears comfortable at rest. HEENT: Conjunctiva  and lids normal, oropharynx clear with moist mucosa. Neck: Supple, increased girth without obvious elevated JVP or carotid bruits, no thyromegaly. Lungs: Clear to auscultation, nonlabored breathing at rest. Cardiac: Regular rate and rhythm, no S3 or significant systolic murmur, no pericardial rub. Abdomen: Soft, nontender, bowel sounds present, no guarding or rebound. Extremities: Trace ankle edema, distal pulses 2+. Skin: Warm and dry. Musculoskeletal: No kyphosis. Neuropsychiatric: Alert and oriented x3, affect grossly appropriate.   Problem List and Plan   Precordial chest pain Noted intermittently in the setting of "stress." Not typically reproducible with certain activities. As noted above, he has cardiac risk factors including type 2 diabetes mellitus, obesity with OSA, hyperlipidemia, and family history of CAD (father in his 61s). He had an ischemic evaluation approximately 5 years ago, would like to start back on a regular exercise plan now. ECG is abnormal. Plan is to obtain an exercise Cardiolite for further risk stratification. We will plan to call him with the results.   Type 2 diabetes mellitus Followed by Dr. Margo Aye on oral therapies detailed above. Recent hemoglobin A1c 6.2.   Essential hypertension Blood pressure low normal today, patient asymptomatic. He continues on Diovan HCT under the direction of Dr. Margo Aye.   Mixed hyperlipidemia Patient on Simcor, recent lab work showing HDL 27 and LDL 48.     Jonelle Sidle, M.D., F.A.C.C.

## 2014-09-04 NOTE — Assessment & Plan Note (Signed)
Patient on Simcor, recent lab work showing HDL 27 and LDL 48.

## 2014-09-04 NOTE — Assessment & Plan Note (Signed)
Noted intermittently in the setting of "stress." Not typically reproducible with certain activities. As noted above, he has cardiac risk factors including type 2 diabetes mellitus, obesity with OSA, hyperlipidemia, and family history of CAD (father in his 6050s). He had an ischemic evaluation approximately 5 years ago, would like to start back on a regular exercise plan now. ECG is abnormal. Plan is to obtain an exercise Cardiolite for further risk stratification. We will plan to call him with the results.

## 2014-09-04 NOTE — Assessment & Plan Note (Signed)
Blood pressure low normal today, patient asymptomatic. He continues on Diovan HCT under the direction of Dr. Margo AyeHall.

## 2014-09-04 NOTE — Patient Instructions (Signed)
Your physician recommends that you schedule a follow-up appointment in: to be determined after your test.we will call you with results   Your physician recommends that you continue on your current medications as directed. Please refer to the Current Medication list given to you today.      Your physician has requested that you have en exercise stress myoview. For further information please visit [email or web address]. Please follow instruction sheet, as given.       Thank you for choosing [facility] !

## 2014-09-21 ENCOUNTER — Encounter (HOSPITAL_COMMUNITY): Payer: BLUE CROSS/BLUE SHIELD

## 2014-09-21 ENCOUNTER — Encounter (HOSPITAL_COMMUNITY): Admission: RE | Admit: 2014-09-21 | Payer: Self-pay | Source: Ambulatory Visit

## 2014-09-26 DIAGNOSIS — R079 Chest pain, unspecified: Secondary | ICD-10-CM

## 2014-09-28 ENCOUNTER — Encounter (HOSPITAL_COMMUNITY)
Admission: RE | Admit: 2014-09-28 | Discharge: 2014-09-28 | Disposition: A | Discharge status: Home / Self Care | Payer: BLUE CROSS/BLUE SHIELD | Source: Ambulatory Visit | Attending: Cardiology | Admitting: Cardiology

## 2014-09-28 ENCOUNTER — Encounter (HOSPITAL_COMMUNITY): Payer: Self-pay

## 2014-09-28 ENCOUNTER — Encounter (HOSPITAL_COMMUNITY)

## 2014-09-28 ENCOUNTER — Ambulatory Visit (HOSPITAL_COMMUNITY)
Admission: RE | Admit: 2014-09-28 | Discharge: 2014-09-28 | Disposition: A | Discharge status: Home / Self Care | Payer: BLUE CROSS/BLUE SHIELD | Source: Ambulatory Visit | Attending: Pulmonary Disease | Admitting: Pulmonary Disease

## 2014-09-28 ENCOUNTER — Ambulatory Visit (HOSPITAL_COMMUNITY)

## 2014-09-28 DIAGNOSIS — R079 Chest pain, unspecified: Secondary | ICD-10-CM | POA: Insufficient documentation

## 2014-09-28 DIAGNOSIS — R072 Precordial pain: Secondary | ICD-10-CM | POA: Diagnosis not present

## 2014-09-28 DIAGNOSIS — I10 Essential (primary) hypertension: Secondary | ICD-10-CM | POA: Insufficient documentation

## 2014-09-28 MED ORDER — TECHNETIUM TC 99M SESTAMIBI GENERIC - CARDIOLITE
10.0000 | Freq: Once | INTRAVENOUS | Status: AC | PRN
  Administered 2014-09-28: 10 via INTRAVENOUS

## 2014-09-28 MED ORDER — SODIUM CHLORIDE 0.9 % IJ SOLN
INTRAMUSCULAR | Status: AC
  Administered 2014-09-28: 10 mL via INTRAVENOUS
  Filled 2014-09-28: qty 3

## 2014-09-28 MED ORDER — REGADENOSON 0.4 MG/5ML IV SOLN
INTRAVENOUS | Status: AC
  Filled 2014-09-28: qty 5

## 2014-09-28 MED ORDER — SODIUM CHLORIDE 0.9 % IJ SOLN
10.0000 mL | INTRAMUSCULAR | Status: DC | PRN
  Administered 2014-09-28: 10 mL via INTRAVENOUS
  Filled 2014-09-28: qty 10

## 2014-09-28 MED ORDER — TECHNETIUM TC 99M SESTAMIBI - CARDIOLITE
30.0000 | Freq: Once | INTRAVENOUS | Status: AC | PRN
  Administered 2014-09-28: 30 via INTRAVENOUS

## 2014-09-28 NOTE — Progress Notes (Signed)
Stress Lab Nurses Notes - Derek Marsh  Derek Marsh 09/28/2014 Reason for doing test: chest pain & HTN Type of test: Stress Cardiolite Nurse performing test: Parke PoissonPhyllis Billingsly, RN Nuclear Medicine Tech: Derek CalLeslie Marsh Echo Tech: Not Applicable MD performing test: S. McDowell/K.Lyman BishopLawrence NP Family MD: Derek Marsh Test explained and consent signed: Yes.   IV started: Saline lock flushed, No redness or edema and Saline lock started in radiology Symptoms: Fatigue Treatment/Intervention: None Reason test stopped: fatigue After recovery IV was: Discontinued via X-ray tech and No redness or edema Patient to return to Nuc. Med at : 11:55 Patient discharged: Home Patient's Condition upon discharge was: stable Comments: During test peak BP 174/80 & HR 151.  Recovery BP 116/82 & HR 90.  Symptoms resolved in recovery. Derek Marsh, Derek Marsh

## 2014-11-19 ENCOUNTER — Ambulatory Visit: Payer: BLUE CROSS/BLUE SHIELD | Admitting: Orthopedic Surgery

## 2014-11-25 ENCOUNTER — Ambulatory Visit (INDEPENDENT_AMBULATORY_CARE_PROVIDER_SITE_OTHER): Payer: BLUE CROSS/BLUE SHIELD | Admitting: Orthopedic Surgery

## 2014-11-25 ENCOUNTER — Ambulatory Visit (INDEPENDENT_AMBULATORY_CARE_PROVIDER_SITE_OTHER): Payer: BLUE CROSS/BLUE SHIELD

## 2014-11-25 VITALS — BP 108/72 | Ht 70.0 in | Wt 295.0 lb

## 2014-11-25 DIAGNOSIS — M1711 Unilateral primary osteoarthritis, right knee: Secondary | ICD-10-CM | POA: Diagnosis not present

## 2014-11-25 DIAGNOSIS — M25561 Pain in right knee: Secondary | ICD-10-CM | POA: Diagnosis not present

## 2014-11-25 NOTE — Patient Instructions (Signed)
Joint Injection  Care After  Refer to this sheet in the next few days. These instructions provide you with information on caring for yourself after you have had a joint injection. Your caregiver also may give you more specific instructions. Your treatment has been planned according to current medical practices, but problems sometimes occur. Call your caregiver if you have any problems or questions after your procedure.  After any type of joint injection, it is not uncommon to experience:  · Soreness, swelling, or bruising around the injection site.  · Mild numbness, tingling, or weakness around the injection site caused by the numbing medicine used before or with the injection.  It also is possible to experience the following effects associated with the specific agent after injection:  · Iodine-based contrast agents:  ¨ Allergic reaction (itching, hives, widespread redness, and swelling beyond the injection site).  · Corticosteroids (These effects are rare.):  ¨ Allergic reaction.  ¨ Increased blood sugar levels (If you have diabetes and you notice that your blood sugar levels have increased, notify your caregiver).  ¨ Increased blood pressure levels.  ¨ Mood swings.  · Hyaluronic acid in the use of viscosupplementation.  ¨ Temporary heat or redness.  ¨ Temporary rash and itching.  ¨ Increased fluid accumulation in the injected joint.  These effects all should resolve within a day after your procedure.   HOME CARE INSTRUCTIONS  · Limit yourself to light activity the day of your procedure. Avoid lifting heavy objects, bending, stooping, or twisting.  · Take prescription or over-the-counter pain medication as directed by your caregiver.  · You may apply ice to your injection site to reduce pain and swelling the day of your procedure. Ice may be applied 03-04 times:  ¨ Put ice in a plastic bag.  ¨ Place a towel between your skin and the bag.  ¨ Leave the ice on for no longer than 15-20 minutes each time.  SEEK  IMMEDIATE MEDICAL CARE IF:   · Pain and swelling get worse rather than better or extend beyond the injection site.  · Numbness does not go away.  · Blood or fluid continues to leak from the injection site.  · You have chest pain.  · You have swelling of your face or tongue.  · You have trouble breathing or you become dizzy.  · You develop a fever, chills, or severe tenderness at the injection site that last longer than 1 day.  MAKE SURE YOU:  · Understand these instructions.  · Watch your condition.  · Get help right away if you are not doing well or if you get worse.  Document Released: 04/20/2011 Document Revised: 10/30/2011 Document Reviewed: 04/20/2011  [employer]® Patient Information ©2015 [employer]. This information is not intended to replace advice given to you by your health care provider. Make sure you discuss any questions you have with your health care provider.

## 2014-11-26 ENCOUNTER — Encounter: Payer: Self-pay | Admitting: Orthopedic Surgery

## 2014-11-26 NOTE — Progress Notes (Signed)
Patient ID: Derek GandyAlvin W Votta, male   DOB: 06/18/1961, 54 y.o.   MRN: [id number]  Chief Complaint  Patient presents with  . Knee Pain    Right knee pain, no injury.     Derek Gandylvin W Kesinger is a 54 y.o. male.   HPI Painful right knee. The patient presents with a five-week history of pain swelling and stiffness in his right knee with intermittent swelling control by ice and Celebrex. He has constant pain which is 3 out of 10 he comes in for evaluation with no history of recent trauma Review of Systems He reports seasonal allergies and joint pain as his only system review findings.  Past Medical History  Diagnosis Date  . Hyperlipidemia   . Obesity   . Ventral hernia   . OSA (obstructive sleep apnea)   . GERD (gastroesophageal reflux disease)   . Low testosterone   . Essential hypertension   . Depression   . Type 2 diabetes mellitus     Past Surgical History  Procedure Laterality Date  . Esophagogastroduodenoscopy    . Colonoscopy  11/13/2011    Procedure: COLONOSCOPY;  Surgeon: Corbin Adeobert M Rourk, MD;  Location: [facility];  Service: Endoscopy;  Laterality: N/A;  9:15 AM    Family History  Problem Relation Age of Onset  . Diabetes Other   . Colon cancer Neg Hx     Social History History  Substance Use Topics  . Smoking status: Former Smoker -- 2.00 packs/day for 15 years    Types: Cigarettes    Start date: 08/24/1977    Quit date: 10/20/2006  . Smokeless tobacco: Never Used  . Alcohol Use: 0.0 oz/week    0 Standard drinks or equivalent per week     Comment: Occasionally    No Known Allergies  Current Outpatient Prescriptions  Medication Sig Dispense Refill  . aspirin 325 MG EC tablet Take 325 mg by mouth daily.      . celecoxib (CELEBREX) 200 MG capsule Take 200 mg by mouth daily.    . cetirizine (ZYRTEC) 10 MG tablet Take 10 mg by mouth daily.      . Dapagliflozin-Metformin HCl ER 5-500 MG TB24 Take 5 mg by mouth 2 (two) times daily.    [name] Kitchen. desvenlafaxine (PRISTIQ) 50 MG  24 hr tablet Take 50 mg by mouth daily.    . Glucosamine-Chondroitin (GLUCOSAMINE CHONDR COMPLEX PO) Take 1 tablet by mouth daily.     . Liraglutide 18 MG/3ML SOPN Inject 1.8 mLs into the skin daily.    . mometasone (NASONEX) 50 MCG/ACT nasal spray Place 2 sprays into the nose daily.      . Multiple Vitamin (MULITIVITAMIN WITH MINERALS) TABS Take 1 tablet by mouth daily.    . niacin-simvastatin (SIMCOR) 1000-20 MG 24 hr tablet Take 1 tablet by mouth at bedtime.      [name] Kitchen. omega-3 acid ethyl esters (LOVAZA) 1 G capsule Take 3 g by mouth daily.     . RABEprazole (ACIPHEX) 20 MG tablet Take 20 mg by mouth daily.      . tadalafil (CIALIS) 20 MG tablet Take 20 mg by mouth daily as needed for erectile dysfunction.    . valsartan-hydrochlorothiazide (DIOVAN-HCT) 80-12.5 MG per tablet Take 1 tablet by mouth daily.     No current facility-administered medications for this visit.       Physical Exam Blood pressure 108/72, height 5\' 10"  (1.778 m), weight 295 lb (133.811 kg). Physical Exam The patient is well developed  well nourished and well groomed. Orientation to person place and time is normal  Mood is pleasant. Ambulatory status normal He has what appears to be an effusion in his right knee but full range of motion his knee is completely stable and motor exam is normal. Provocative tests for meniscal pathology are negative skin is intact sensation is normal and good distal pulses are noted He appears to have some tenderness along the medial and lateral joint line as well as the lateral patellofemoral joint which is the most symptomatic area. Data Reviewed Plain films show medial compartment narrowing  Assessment Encounter Diagnoses  Name Primary?  . Right knee pain   . Primary osteoarthritis of right knee Yes    Plan Recommend injection continue Celebrex if no improvement then we will consider MRI and arthroscopic evaluation and debridement.  Procedure note right knee injection verbal  consent was obtained to inject right knee joint  Timeout was completed to confirm the site of injection  The medications used were 40 mg of Depo-Medrol and 1% lidocaine 3 cc  Anesthesia was provided by ethyl chloride and the skin was prepped with alcohol.  After cleaning the skin with alcohol a 20-gauge needle was used to inject the right knee joint. There were no complications. A sterile bandage was applied.

## 2015-01-07 ENCOUNTER — Ambulatory Visit: Payer: BLUE CROSS/BLUE SHIELD | Admitting: Orthopedic Surgery

## 2015-01-19 ENCOUNTER — Encounter: Payer: Self-pay | Admitting: Orthopedic Surgery

## 2015-01-19 ENCOUNTER — Ambulatory Visit (INDEPENDENT_AMBULATORY_CARE_PROVIDER_SITE_OTHER): Payer: BLUE CROSS/BLUE SHIELD | Admitting: Orthopedic Surgery

## 2015-01-19 VITALS — BP 100/74 | Ht 70.0 in | Wt 295.0 lb

## 2015-01-19 DIAGNOSIS — M1711 Unilateral primary osteoarthritis, right knee: Secondary | ICD-10-CM

## 2015-01-19 NOTE — Patient Instructions (Signed)
Stay on Celebrex.

## 2015-01-19 NOTE — Progress Notes (Signed)
Patient ID: Derek Marsh, male   DOB: 07/13/1961, 54 y.o.   MRN: [id number] Patient ID: Derek Marsh, male   DOB: [birth year], 54 y.o.   MRN: [id number]  Chief Complaint  Patient presents with  . Follow-up    6 week recheck on right knee following injection.    HPI Derek Marsh is a 54 y.o. male.  He comes in today for a follow-up visit after I injected his right knee and kept him on Celebrex. He says his knee is much better he has some difficulty when he goes down the steps but he can climb steps and emesis jobsite really well except for at the end of the day when he feels some lateral discomfort  Systems nothing new to add he has a history of seasonal allergies as we noted last time and his joint pain was his only positive findings   Past Medical History  Diagnosis Date  . Hyperlipidemia   . Obesity   . Ventral hernia   . OSA (obstructive sleep apnea)   . GERD (gastroesophageal reflux disease)   . Low testosterone   . Essential hypertension   . Depression   . Type 2 diabetes mellitus     Past Surgical History  Procedure Laterality Date  . Esophagogastroduodenoscopy    . Colonoscopy  11/13/2011    Procedure: COLONOSCOPY;  Surgeon: Corbin Ade, MD;  Location: [facility];  Service: Endoscopy;  Laterality: N/A;  9:15 AM     No Known Allergies  Current Outpatient Prescriptions  Medication Sig Dispense Refill  . aspirin 325 MG EC tablet Take 325 mg by mouth daily.      . celecoxib (CELEBREX) 200 MG capsule Take 200 mg by mouth daily.    . cetirizine (ZYRTEC) 10 MG tablet Take 10 mg by mouth daily.      . Dapagliflozin-Metformin HCl ER 5-500 MG TB24 Take 5 mg by mouth 2 (two) times daily.    [name] Kitchen desvenlafaxine (PRISTIQ) 50 MG 24 hr tablet Take 50 mg by mouth daily.    . Glucosamine-Chondroitin (GLUCOSAMINE CHONDR COMPLEX PO) Take 1 tablet by mouth daily.     . Liraglutide 18 MG/3ML SOPN Inject 1.8 mLs into the skin daily.    . mometasone (NASONEX) 50 MCG/ACT nasal spray Place  2 sprays into the nose daily.      . Multiple Vitamin (MULITIVITAMIN WITH MINERALS) TABS Take 1 tablet by mouth daily.    . niacin-simvastatin (SIMCOR) 1000-20 MG 24 hr tablet Take 1 tablet by mouth at bedtime.      [name] Kitchen omega-3 acid ethyl esters (LOVAZA) 1 G capsule Take 3 g by mouth daily.     . RABEprazole (ACIPHEX) 20 MG tablet Take 20 mg by mouth daily.      . tadalafil (CIALIS) 20 MG tablet Take 20 mg by mouth daily as needed for erectile dysfunction.    . valsartan-hydrochlorothiazide (DIOVAN-HCT) 80-12.5 MG per tablet Take 1 tablet by mouth daily.     No current facility-administered medications for this visit.    Review of Systems Review of Systems    Physical Exam Blood pressure 100/74, height  (1.778 m), weight 295 lb (133.811 kg).  Physical Exam BP 100/74 mmHg  Ht  (1.778 m)  Wt 295 lb (133.811 kg)  BMI 42.33 kg/m2 His knee looks pretty good today at that little bit of tenderness over the iliotibial band appears to be some crepitance there has some painful crepitation on range  of motion related to the patellofemoral joint the joint lines are nontender knee is stable motor exam is normal no effusion ambulatory status normal no assisted devices. Grooming hygiene normal. Data Reviewed No new data reviewed  Assessment    I think he may have a hint of iliotibial band friction syndrome drawn based on exam  He does have the osteoarthritis    Plan    Continue Celebrex follow-up with me in 6 weeks. He would like to have his back examined. His symptoms include long history of back pain related to picking up simple objects sneezing and coughing no leg pain or radicular symptoms related. Seen by chiropractor and massage therapist with fairly good results but notes symptoms are more frequent       Derek CanadaStanley Damichael Marsh 01/19/2015, 5:15 PM

## 2015-02-04 ENCOUNTER — Ambulatory Visit (INDEPENDENT_AMBULATORY_CARE_PROVIDER_SITE_OTHER): Payer: BLUE CROSS/BLUE SHIELD | Admitting: Orthopedic Surgery

## 2015-02-04 ENCOUNTER — Encounter: Payer: Self-pay | Admitting: Orthopedic Surgery

## 2015-02-04 ENCOUNTER — Ambulatory Visit (INDEPENDENT_AMBULATORY_CARE_PROVIDER_SITE_OTHER): Payer: BLUE CROSS/BLUE SHIELD

## 2015-02-04 VITALS — BP 115/70 | Ht 70.0 in | Wt 295.0 lb

## 2015-02-04 DIAGNOSIS — M545 Low back pain: Secondary | ICD-10-CM

## 2015-02-04 DIAGNOSIS — M5137 Other intervertebral disc degeneration, lumbosacral region: Secondary | ICD-10-CM

## 2015-02-04 NOTE — Progress Notes (Signed)
Patient ID: Derek Marsh, male   DOB: July 11, 1961, 54 y.o.   MRN: [id number] New problem   Chief Complaint  Patient presents with  . Back Pain    LOW BACK PAIN x 3 weeks     Derek Marsh is a 54 y.o. male.   HPI 54 year old male with several year history of intermittent back pain brought on by coughing or sneezing without radicular symptoms but centralized back pain. He says he can lift anything heavy but if he sneezes or coughs or lifts something simple off the floor he will get acute pain in the central part of his lower back which will last for several hours to days. He currently sees a massage therapist take Celebrex and Tylenol for intermittent back pain  He denies numbness tingling bowel dysfunction or bladder dysfunction. Review of Systems See hpi  Past Medical History  Diagnosis Date  . Hyperlipidemia   . Obesity   . Ventral hernia   . OSA (obstructive sleep apnea)   . GERD (gastroesophageal reflux disease)   . Low testosterone   . Essential hypertension   . Depression   . Type 2 diabetes mellitus     Past Surgical History  Procedure Laterality Date  . Esophagogastroduodenoscopy    . Colonoscopy  11/13/2011    Procedure: COLONOSCOPY;  Surgeon: Corbin Ade, MD;  Location: [facility];  Service: Endoscopy;  Laterality: N/A;  9:15 AM    Family History  Problem Relation Age of Onset  . Diabetes Other   . Colon cancer Neg Hx     Social History History  Substance Use Topics  . Smoking status: Former Smoker -- 2.00 packs/day for 15 years    Types: Cigarettes    Start date: 08/24/1977    Quit date: 10/20/2006  . Smokeless tobacco: Never Used  . Alcohol Use: 0.0 oz/week    0 Standard drinks or equivalent per week     Comment: Occasionally    No Known Allergies  Current Outpatient Prescriptions  Medication Sig Dispense Refill  . aspirin 325 MG EC tablet Take 325 mg by mouth daily.      . celecoxib (CELEBREX) 200 MG capsule Take 200 mg by mouth daily.    .  cetirizine (ZYRTEC) 10 MG tablet Take 10 mg by mouth daily.      . Dapagliflozin-Metformin HCl ER 5-500 MG TB24 Take 5 mg by mouth 2 (two) times daily.    [name] Kitchen desvenlafaxine (PRISTIQ) 50 MG 24 hr tablet Take 50 mg by mouth daily.    . Glucosamine-Chondroitin (GLUCOSAMINE CHONDR COMPLEX PO) Take 1 tablet by mouth daily.     . Liraglutide 18 MG/3ML SOPN Inject 1.8 mLs into the skin daily.    . mometasone (NASONEX) 50 MCG/ACT nasal spray Place 2 sprays into the nose daily.      . Multiple Vitamin (MULITIVITAMIN WITH MINERALS) TABS Take 1 tablet by mouth daily.    . niacin-simvastatin (SIMCOR) 1000-20 MG 24 hr tablet Take 1 tablet by mouth at bedtime.      [name] Kitchen omega-3 acid ethyl esters (LOVAZA) 1 G capsule Take 3 g by mouth daily.     . RABEprazole (ACIPHEX) 20 MG tablet Take 20 mg by mouth daily.      . tadalafil (CIALIS) 20 MG tablet Take 20 mg by mouth daily as needed for erectile dysfunction.    . valsartan-hydrochlorothiazide (DIOVAN-HCT) 80-12.5 MG per tablet Take 1 tablet by mouth daily.     No current  facility-administered medications for this visit.       Physical Exam Blood pressure 115/70, height  (1.778 m), weight 295 lb (133.811 kg). Physical Exam The patient is well developed well nourished and well groomed. Orientation to person place and time is normal  Mood is pleasant. Ambulatory status normal Central back pain and tenderness mild right-sided tenderness right above the SI joint and in the right buttock His reflexes are 2+ and normal he has normal sensation in both feet has good pulses in both feet without peripheral edema both extremities are warm to touch he has normal flexion extension of his back without pain he has normal hip heights and pelvic heights with no obliquity or scoliosis lower back is normal  Data Reviewed I ordered an x-ray of his back I interpreted that x-ray as Facet joint arthritis at L4-5 abnormal contours [facility] and lateral x-rays in terms of spinal  alignment     Assessment Encounter Diagnoses  Name Primary?  . Low back pain, unspecified back pain laterality, with sciatica presence unspecified Yes  . DDD (degenerative disc disease), lumbosacral     Plan At this point without any frank radicular symptoms or bowel or bladder dysfunction he would be prudent for him to get a lumbar stabilization program and lose a significant amount of weight. Perhaps when he has another attack it would be prudent to get an MRI of his back but at this point he is not a surgical candidate nor does he have surgical indications. We should also encourage back mechanics.

## 2015-02-04 NOTE — Patient Instructions (Addendum)
Call aph therapy dept to schedule therapy   Back Pain, Adult Low back pain is very common. About 1 in 5 people have back pain.The cause of low back pain is rarely dangerous. The pain often gets better over time.About half of people with a sudden onset of back pain feel better in just 2 weeks. About 8 in 10 people feel better by 6 weeks.  CAUSES Some common causes of back pain include:  Strain of the muscles or ligaments supporting the spine.  Wear and tear (degeneration) of the spinal discs.  Arthritis.  Direct injury to the back. DIAGNOSIS Most of the time, the direct cause of low back pain is not known.However, back pain can be treated effectively even when the exact cause of the pain is unknown.Answering your caregiver's questions about your overall health and symptoms is one of the most accurate ways to make sure the cause of your pain is not dangerous. If your caregiver needs more information, he or she may order lab work or imaging tests (X-rays or MRIs).However, even if imaging tests show changes in your back, this usually does not require surgery. HOME CARE INSTRUCTIONS For many people, back pain returns.Since low back pain is rarely dangerous, it is often a condition that people can learn to [facility] their own.   Remain active. It is stressful on the back to sit or stand in one place. Do not sit, drive, or stand in one place for more than 30 minutes at a time. Take short walks on level surfaces as soon as pain allows.Try to increase the length of time you walk each day.  Do not stay in bed.Resting more than 1 or 2 days can delay your recovery.  Do not avoid exercise or work.Your body is made to move.It is not dangerous to be active, even though your back may hurt.Your back will likely heal faster if you return to being active before your pain is gone.  Pay attention to your body when you bend and lift. Many people have less discomfortwhen lifting if they bend their  knees, keep the load close to their bodies,and avoid twisting. Often, the most comfortable positions are those that put less stress on your recovering back.  Find a comfortable position to sleep. Use a firm mattress and lie on your side with your knees slightly bent. If you lie on your back, put a pillow under your knees.  Only take over-the-counter or prescription medicines as directed by your caregiver. Over-the-counter medicines to reduce pain and inflammation are often the most helpful.Your caregiver may prescribe muscle relaxant drugs.These medicines help dull your pain so you can more quickly return to your normal activities and healthy exercise.  Put ice on the injured area.  Put ice in a plastic bag.  Place a towel between your skin and the bag.  Leave the ice on for 15-20 minutes, 03-04 times a day for the first 2 to 3 days. After that, ice and heat may be alternated to reduce pain and spasms.  Ask your caregiver about trying back exercises and gentle massage. This may be of some benefit.  Avoid feeling anxious or stressed.Stress increases muscle tension and can worsen back pain.It is important to recognize when you are anxious or stressed and learn ways to manage it.Exercise is a great option. SEEK MEDICAL CARE IF:  You have pain that is not relieved with rest or medicine.  You have pain that does not improve in 1 week.  You have new  symptoms.  You are generally not feeling well. SEEK IMMEDIATE MEDICAL CARE IF:   You have pain that radiates from your back into your legs.  You develop new bowel or bladder control problems.  You have unusual weakness or numbness in your arms or legs.  You develop nausea or vomiting.  You develop abdominal pain.  You feel faint. Document Released: 08/07/2005 Document Revised: 02/06/2012 Document Reviewed: 12/09/2013 [facility] Patient Information 2015 [place], [place]. This information is not intended to replace advice given to you  by your health care provider. Make sure you discuss any questions you have with your health care provider. Back Injury Prevention The following tips can help you to prevent a back injury. PHYSICAL FITNESS  Exercise often. Try to develop strong stomach (abdominal) muscles.  Do aerobic exercises often. This includes walking, jogging, biking, swimming.  Do exercises that help with balance and strength often. This includes tai chi and yoga.  Stretch before and after you exercise.  Keep a healthy weight.    Stop smoking if you smoke. POSTURE   Sit and stand up straight. Avoid leaning forward or hunching over.  Choose chairs that support your lower back.  If you work at a desk:  Sit close to your work so you do not lean over.  Keep your chin tucked in.  Keep your neck drawn back.  Keep your elbows bent at a right angle. Your arms should look like the letter "L."  Sit high and close to the steering wheel when you drive. Add low back support to your car seat if needed.  Avoid sitting or standing in one position for too long. Get up and move around every hour. Take breaks if you are driving for a long time.  Sleep on your side with your knees slightly bent. You can also sleep on your back with a pillow under your knees. Do not sleep on your stomach. LIFTING, TWISTING, AND REACHING  Avoid heavy lifting, especially lifting over and over again. If you must do heavy lifting:  Stretch before lifting.  Work slowly.  Rest between lifts.  Use carts and dollies to move objects when possible.  Make several small trips instead of carrying 1 heavy load.  Ask for help when you need it.  Ask for help when moving big, awkward objects.  Follow these steps when lifting:  Stand with your feet shoulder-width apart.  Get as close to the object as you can. Do not pick up heavy objects that are far from your body.  Use handles or lifting straps when possible.  Bend at your knees.  Squat down, but keep your heels off the floor.  Keep your shoulders back, your chin tucked in, and your back straight.  Lift the object slowly. Tighten the muscles in your legs, stomach, and butt. Keep the object as close to the center of your body as possible.  Reverse these directions when you put a load down.  Do not:  Lift the object above your waist.  Twist at the waist while lifting or carrying a load. Move your feet if you need to turn, not your waist.  Bend over without bending at your knees.  Avoid reaching over your head, across a table, or for an object on a high surface. OTHER TIPS  Avoid wet floors and keep sidewalks clear of ice.  Do not sleep on a mattress that is too soft or too hard.  Keep items that you use often within easy reach.  Put heavier objects on shelves at waist level. Put lighter objects on lower or higher shelves.  Find ways to lessen your stress. You can try exercise, massage, or relaxation.  Get help for depression or anxiety if needed. GET HELP IF:  You injure your back.  You have questions about diet, exercise, or other ways to prevent back injuries. MAKE SURE YOU:  Understand these instructions.  Will watch your condition.  Will get help right away if you are not doing well or get worse. Document Released: 01/24/2008 Document Revised: 10/30/2011 Document Reviewed: 09/18/2011 [facility] Patient Information 2015 [place], [place]. This information is not intended to replace advice given to you by your health care provider. Make sure you discuss any questions you have with your health care provider.

## 2015-03-02 ENCOUNTER — Ambulatory Visit (INDEPENDENT_AMBULATORY_CARE_PROVIDER_SITE_OTHER): Payer: BLUE CROSS/BLUE SHIELD | Admitting: Orthopedic Surgery

## 2015-03-02 ENCOUNTER — Encounter: Payer: Self-pay | Admitting: Orthopedic Surgery

## 2015-03-02 VITALS — BP 94/64 | Ht 70.0 in | Wt 295.0 lb

## 2015-03-02 DIAGNOSIS — M545 Low back pain: Secondary | ICD-10-CM

## 2015-03-02 DIAGNOSIS — M1711 Unilateral primary osteoarthritis, right knee: Secondary | ICD-10-CM | POA: Diagnosis not present

## 2015-03-02 NOTE — Progress Notes (Signed)
Patient ID: Derek GandyAlvin W Marsh, male   DOB: 06/20/1961, 54 y.o.   MRN: [id number]  Follow up visit  Chief Complaint  Patient presents with  . Follow-up    6 week follow up right knee    BP 94/64 mmHg  Ht 5\' 10"  (1.778 m)  Wt 295 lb (133.811 kg)  BMI 42.33 kg/m2  Encounter Diagnoses  Name Primary?  . Low back pain, unspecified back pain laterality, with sciatica presence unspecified   . Primary osteoarthritis of right knee Yes     status post right knee injection also follow for lumbar disc disease  Lumbar spine is stable at this time  Right knee feels much better he still has some pain going down the stairs but no pain on level ground  Review of systems no catching locking or giving way  Examination stable vital signs appearance is normal inspection shows no effusions range of motion is full stability is normal McMurray sign is negative medial joint line tendon tenderness is much improved  Recommend continue Celebrex follow-up as needed

## 2015-03-08 ENCOUNTER — Ambulatory Visit (HOSPITAL_COMMUNITY): Payer: BLUE CROSS/BLUE SHIELD | Attending: Orthopedic Surgery | Admitting: Physical Therapy

## 2015-03-08 DIAGNOSIS — M2560 Stiffness of unspecified joint, not elsewhere classified: Secondary | ICD-10-CM | POA: Diagnosis present

## 2015-03-08 DIAGNOSIS — M25652 Stiffness of left hip, not elsewhere classified: Secondary | ICD-10-CM | POA: Diagnosis present

## 2015-03-08 DIAGNOSIS — M545 Low back pain, unspecified: Secondary | ICD-10-CM

## 2015-03-08 DIAGNOSIS — R198 Other specified symptoms and signs involving the digestive system and abdomen: Secondary | ICD-10-CM | POA: Diagnosis present

## 2015-03-08 DIAGNOSIS — M25651 Stiffness of right hip, not elsewhere classified: Secondary | ICD-10-CM

## 2015-03-08 DIAGNOSIS — M2569 Stiffness of other specified joint, not elsewhere classified: Secondary | ICD-10-CM

## 2015-03-08 NOTE — Patient Instructions (Signed)
   BRIDGING  While lying on your back, tighten your lower abdominals, squeeze your buttocks and then raise your buttocks off the floor/bed as creating a "Bridge" with your body. Repeat 15 times, twice a day.    Lumbar Spine Rotation (Quadratus Lumborum)  Lie on the back with the knee and hips bent and feet flat on the floor.  Place the arms to the sides and outward, palms up.  Place the left leg over the right leg. Slowly allow both legs to drop towards the left, stretching the right side.  Repeat 10 times with five second holds each direction, twice a day.    DOUBLE KNEE TO CHEST STRETCH - DKTC  While Lying on your back,  hold your knees and gently pull them up towards your chest. You can use a towel behind your legs to help you hold onto your legs. Hold for 5-10 seconds, five times, 2 times a day.    DEAD BUG  While lying on your back with your knees bent, slowly raise up one foot and opposite arm.  Retrun to starting position and then repeat on the opposite side.   Keep your low back flat on the floor the entire time.  Repeat 10 times each side, twice a day.    Transverse Abdominus Activation  Contract your lower abdominals as if you were trying to lift one leg from the table.  Initiate the movement but do no lift foot greater than 1 inch from the table.  Repeat opposite side. Perform 10 times each side, twice a day.    ABDOMINAL SQUEEZE  Tighten your stomach muscles as if you were sucking your belly button to your spine. Hold for a count of 2-3 and then release. Repeat this 100 times throughout your day, however you may break these up however you'd like- IE 10 sets of 10, 5 sets of 20, etc.    PIRIFORMIS AND HIP STRETCH - SEATED  While sitting in a chair, cross your affected leg on top of the other as shown.   Next, gently lean forward until a stretch is felt along the crossed leg. Hold for 30 seconds and repeat twice each leg.

## 2015-03-08 NOTE — Therapy (Signed)
[facility] [address] [place], [place], [address] Phone: [phone]   Fax:  [phone]  Physical Therapy Evaluation  Patient Details  Name: Derek Marsh MRN: [id number] Date of Birth: April 09, 1961 Referring Provider:  Vickki Hearing, MD  Encounter Date: 03/08/2015      PT End of Session - 03/08/15 1729    Visit Number 1   Number of Visits 1   Authorization Type [employer]    Authorization Time Period 03/08/15 to 05/09/15   PT Start Time 1644   PT Stop Time 1715   PT Time Calculation (min) 31 min   Activity Tolerance Patient tolerated treatment well   Behavior During Therapy [facility] for tasks assessed/performed      Past Medical History  Diagnosis Date  . Hyperlipidemia   . Obesity   . Ventral hernia   . OSA (obstructive sleep apnea)   . GERD (gastroesophageal reflux disease)   . Low testosterone   . Essential hypertension   . Depression   . Type 2 diabetes mellitus     Past Surgical History  Procedure Laterality Date  . Esophagogastroduodenoscopy    . Colonoscopy  11/13/2011    Procedure: COLONOSCOPY;  Surgeon: Corbin Ade, MD;  Location: [facility];  Service: Endoscopy;  Laterality: N/A;  9:15 AM    There were no vitals filed for this visit.  Visit Diagnosis:  Right-sided low back pain without sciatica - Plan: PT plan of care cert/re-cert  Back stiffness - Plan: PT plan of care cert/re-cert  Hip stiffness, left - Plan: PT plan of care cert/re-cert  Hip stiffness, right - Plan: PT plan of care cert/re-cert  Abdominal weakness - Plan: PT plan of care cert/re-cert      Subjective Assessment - 03/08/15 1646    Subjective Feels more like a muscle pull, can usually pin point it. If its not bothering him, he's fine, but if he twists or aggravates it it becomes spasmed in R lower spine.    Pertinent History Patient reports that he hurt his back pretty badly in the mid-90s; picked up a big log and turned. Lately,  smaller things have been hurting when he bends down to pick them up. Coughing/sneezing also aggravates the pain.    Currently in Pain? No/denies            [facility] PT Assessment - 03/08/15 0001    Assessment   Medical Diagnosis lumbar stabilization HEP    Onset Date/Surgical Date 03/07/93  approximate    Next MD Visit No follow up with Dr. Starling Manns; follow up with Dr. Margo Aye in August    Precautions   Precautions None   Restrictions   Weight Bearing Restrictions No   Balance Screen   Has the patient fallen in the past 6 months No   Has the patient had a decrease in activity level because of a fear of falling?  No   Is the patient reluctant to leave their home because of a fear of falling?  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    Leisure relax    Observation/Other Assessments   Focus on Therapeutic Outcomes (FOTO)  39% limited    Posture/Postural Control   Posture Comments forward head, B shoulder IR, valgus knees, slight flexion at hips  AROM   Right Hip External Rotation  --  full range    Right Hip Internal Rotation  36   Left Hip External Rotation  --  full range    Left Hip Internal Rotation  28   Lumbar Flexion 87   Lumbar Extension 18   Lumbar - Right Side Bend 30   Lumbar - Left Side Bend 31   Lumbar - Right Rotation limited approx 40%   Lumbar - Left Rotation limited approx 40%   Strength   Overall Strength Comments lower abs approx 3/5, upper abs approx 4/5    Right Hip Extension 4/5   Left Hip Flexion 4/5   Left Hip Extension 4/5   Left Hip ABduction 4+/5   Right Knee Flexion 5/5   Right Knee Extension 5/5   Left Knee Flexion 5/5   Left Knee Extension 5/5   Right Ankle Dorsiflexion 5/5   Left Ankle Dorsiflexion 5/5   Ambulation/Gait   Gait  Comments proximal muscle weakness, reduced rotation of trunk and pelvis                            PT Education - 03/08/15 1728    Education provided Yes   Education Details detailed HEP, able to return to PT with new MD referral if needed    Person(s) Educated Patient   Methods Explanation;Demonstration;Handout   Comprehension Verbalized understanding;Returned demonstration          PT Short Term Goals - 03/08/15 1733    PT SHORT TERM GOAL #1   Title Patient to be independent in approprate HEP to manage back pain and reduce risk of recurrence of symptoms    Time 1   Period Days   Status On-going           PT Long Term Goals - 03/08/15 1734    PT LONG TERM GOAL #1   Title Patient to experience no more than 1/10 pain in his low back at worst when symptoms are exacerbated    Time 4   Period Weeks   Status New               Plan - 03/08/15 1730    Clinical Impression Statement Patient presents with low back pain and MD request for one visit for design of appropriate lumbar stabilization HEP. Patient's main deficits appear to be bilateral hip stiffness, low back pain, core weakness, and low back stiffness at this time. Desgined appropriate HEP and answered all of patient's questions; also advised patient that if program does not assist in reducing back pain that he will be able to return to skilled PT services with new MD referral. Patient agreeable to one time visit for HEP at this time.    Pt will benefit from skilled therapeutic intervention in order to improve on the following deficits Hypomobility;Obesity;Decreased activity tolerance;Decreased strength;Pain;Increased muscle spasms;Improper body mechanics;Decreased coordination;Postural dysfunction;Impaired flexibility   Rehab Potential Good   PT Frequency One time visit   PT Treatment/Interventions ADLs/Self Care Home Management;Moist Heat;DME Instruction;Gait training;Stair training;Functional  mobility training;Therapeutic activities;Therapeutic exercise;Balance training;Neuromuscular re-education;Patient/family education;Manual techniques   PT Next Visit Plan one time visit    PT Home Exercise Plan given    Consulted and Agree with Plan of Care Patient         Problem List Patient Active Problem List   Diagnosis Date Noted  . Essential hypertension 09/04/2014  . Type 2 diabetes mellitus 09/04/2014  .  Precordial chest pain 09/04/2014  . Mixed hyperlipidemia 02/25/2009  . OBESITY 02/25/2009  . OBSTRUCTIVE SLEEP APNEA 02/25/2009    Nedra Hai PT, DPT [phone]  [facility] [address] [place], [place], [address] Phone: [phone]   Fax:  [phone]

## 2015-08-11 ENCOUNTER — Other Ambulatory Visit (HOSPITAL_COMMUNITY): Payer: Self-pay | Admitting: Internal Medicine

## 2015-08-11 DIAGNOSIS — L729 Follicular cyst of the skin and subcutaneous tissue, unspecified: Secondary | ICD-10-CM

## 2015-08-12 ENCOUNTER — Ambulatory Visit (HOSPITAL_COMMUNITY)
Admission: RE | Admit: 2015-08-12 | Discharge: 2015-08-12 | Disposition: A | Discharge status: Home / Self Care | Payer: BLUE CROSS/BLUE SHIELD | Source: Ambulatory Visit | Attending: Internal Medicine | Admitting: Internal Medicine

## 2015-08-12 DIAGNOSIS — I861 Scrotal varices: Secondary | ICD-10-CM | POA: Diagnosis not present

## 2015-08-12 DIAGNOSIS — N503 Cyst of epididymis: Secondary | ICD-10-CM | POA: Insufficient documentation

## 2015-08-12 DIAGNOSIS — L729 Follicular cyst of the skin and subcutaneous tissue, unspecified: Secondary | ICD-10-CM | POA: Diagnosis present

## 2015-08-12 DIAGNOSIS — N433 Hydrocele, unspecified: Secondary | ICD-10-CM | POA: Diagnosis not present

## 2015-12-03 ENCOUNTER — Encounter (HOSPITAL_COMMUNITY): Payer: Self-pay

## 2016-03-17 DIAGNOSIS — G4733 Obstructive sleep apnea (adult) (pediatric): Secondary | ICD-10-CM | POA: Diagnosis not present

## 2016-04-17 DIAGNOSIS — G4733 Obstructive sleep apnea (adult) (pediatric): Secondary | ICD-10-CM | POA: Diagnosis not present

## 2016-05-18 DIAGNOSIS — G4733 Obstructive sleep apnea (adult) (pediatric): Secondary | ICD-10-CM | POA: Diagnosis not present

## 2016-05-23 DIAGNOSIS — E1140 Type 2 diabetes mellitus with diabetic neuropathy, unspecified: Secondary | ICD-10-CM | POA: Diagnosis not present

## 2016-05-23 DIAGNOSIS — Z23 Encounter for immunization: Secondary | ICD-10-CM | POA: Diagnosis not present

## 2016-05-23 DIAGNOSIS — Z87891 Personal history of nicotine dependence: Secondary | ICD-10-CM | POA: Diagnosis not present

## 2016-05-23 DIAGNOSIS — I10 Essential (primary) hypertension: Secondary | ICD-10-CM | POA: Diagnosis not present

## 2016-05-25 DIAGNOSIS — G4733 Obstructive sleep apnea (adult) (pediatric): Secondary | ICD-10-CM | POA: Diagnosis not present

## 2016-05-26 DIAGNOSIS — M545 Low back pain: Secondary | ICD-10-CM | POA: Diagnosis not present

## 2016-05-26 DIAGNOSIS — M9903 Segmental and somatic dysfunction of lumbar region: Secondary | ICD-10-CM | POA: Diagnosis not present

## 2016-05-26 DIAGNOSIS — M9902 Segmental and somatic dysfunction of thoracic region: Secondary | ICD-10-CM | POA: Diagnosis not present

## 2016-05-26 DIAGNOSIS — M9905 Segmental and somatic dysfunction of pelvic region: Secondary | ICD-10-CM | POA: Diagnosis not present

## 2016-06-01 ENCOUNTER — Other Ambulatory Visit (HOSPITAL_COMMUNITY): Payer: Self-pay | Admitting: Internal Medicine

## 2016-06-01 DIAGNOSIS — I10 Essential (primary) hypertension: Secondary | ICD-10-CM

## 2016-06-01 DIAGNOSIS — E6609 Other obesity due to excess calories: Secondary | ICD-10-CM

## 2016-06-01 DIAGNOSIS — Z87891 Personal history of nicotine dependence: Secondary | ICD-10-CM

## 2016-06-13 ENCOUNTER — Other Ambulatory Visit (HOSPITAL_COMMUNITY): Payer: Self-pay | Admitting: Internal Medicine

## 2016-06-13 DIAGNOSIS — E6609 Other obesity due to excess calories: Secondary | ICD-10-CM

## 2016-06-13 DIAGNOSIS — I10 Essential (primary) hypertension: Secondary | ICD-10-CM

## 2016-06-13 DIAGNOSIS — Z87891 Personal history of nicotine dependence: Secondary | ICD-10-CM

## 2016-06-17 DIAGNOSIS — G4733 Obstructive sleep apnea (adult) (pediatric): Secondary | ICD-10-CM | POA: Diagnosis not present

## 2016-06-20 ENCOUNTER — Ambulatory Visit (HOSPITAL_COMMUNITY): Admission: RE | Admit: 2016-06-20 | Payer: BLUE CROSS/BLUE SHIELD | Source: Ambulatory Visit

## 2016-06-20 ENCOUNTER — Ambulatory Visit (HOSPITAL_COMMUNITY)
Admission: RE | Admit: 2016-06-20 | Discharge: 2016-06-20 | Disposition: A | Discharge status: Home / Self Care | Payer: BLUE CROSS/BLUE SHIELD | Source: Ambulatory Visit | Attending: Internal Medicine | Admitting: Internal Medicine

## 2016-06-20 DIAGNOSIS — Z87891 Personal history of nicotine dependence: Secondary | ICD-10-CM

## 2016-06-20 DIAGNOSIS — I739 Peripheral vascular disease, unspecified: Secondary | ICD-10-CM | POA: Insufficient documentation

## 2016-06-20 DIAGNOSIS — E291 Testicular hypofunction: Secondary | ICD-10-CM | POA: Diagnosis not present

## 2016-06-20 DIAGNOSIS — E6609 Other obesity due to excess calories: Secondary | ICD-10-CM

## 2016-06-20 DIAGNOSIS — I77811 Abdominal aortic ectasia: Secondary | ICD-10-CM | POA: Diagnosis not present

## 2016-06-20 DIAGNOSIS — R0989 Other specified symptoms and signs involving the circulatory and respiratory systems: Secondary | ICD-10-CM | POA: Diagnosis not present

## 2016-06-20 DIAGNOSIS — I10 Essential (primary) hypertension: Secondary | ICD-10-CM

## 2016-06-20 DIAGNOSIS — M9903 Segmental and somatic dysfunction of lumbar region: Secondary | ICD-10-CM | POA: Diagnosis not present

## 2016-06-20 DIAGNOSIS — M545 Low back pain: Secondary | ICD-10-CM | POA: Diagnosis not present

## 2016-06-20 DIAGNOSIS — M9905 Segmental and somatic dysfunction of pelvic region: Secondary | ICD-10-CM | POA: Diagnosis not present

## 2016-06-20 DIAGNOSIS — M9902 Segmental and somatic dysfunction of thoracic region: Secondary | ICD-10-CM | POA: Diagnosis not present

## 2016-07-18 DIAGNOSIS — G4733 Obstructive sleep apnea (adult) (pediatric): Secondary | ICD-10-CM | POA: Diagnosis not present

## 2016-08-26 DIAGNOSIS — R05 Cough: Secondary | ICD-10-CM | POA: Diagnosis not present

## 2016-08-26 DIAGNOSIS — S39001D Unspecified injury of muscle, fascia and tendon of abdomen, subsequent encounter: Secondary | ICD-10-CM | POA: Diagnosis not present

## 2016-08-26 DIAGNOSIS — Z6841 Body Mass Index (BMI) 40.0 and over, adult: Secondary | ICD-10-CM | POA: Diagnosis not present

## 2016-08-26 DIAGNOSIS — J060 Acute laryngopharyngitis: Secondary | ICD-10-CM | POA: Diagnosis not present

## 2016-12-11 DIAGNOSIS — M9902 Segmental and somatic dysfunction of thoracic region: Secondary | ICD-10-CM | POA: Diagnosis not present

## 2016-12-11 DIAGNOSIS — M9903 Segmental and somatic dysfunction of lumbar region: Secondary | ICD-10-CM | POA: Diagnosis not present

## 2016-12-11 DIAGNOSIS — M545 Low back pain: Secondary | ICD-10-CM | POA: Diagnosis not present

## 2016-12-11 DIAGNOSIS — M9905 Segmental and somatic dysfunction of pelvic region: Secondary | ICD-10-CM | POA: Diagnosis not present

## 2017-01-01 DIAGNOSIS — E1140 Type 2 diabetes mellitus with diabetic neuropathy, unspecified: Secondary | ICD-10-CM | POA: Diagnosis not present

## 2017-01-01 DIAGNOSIS — E291 Testicular hypofunction: Secondary | ICD-10-CM | POA: Diagnosis not present

## 2017-01-01 DIAGNOSIS — I10 Essential (primary) hypertension: Secondary | ICD-10-CM | POA: Diagnosis not present

## 2017-01-02 DIAGNOSIS — E119 Type 2 diabetes mellitus without complications: Secondary | ICD-10-CM | POA: Diagnosis not present

## 2017-01-02 DIAGNOSIS — E782 Mixed hyperlipidemia: Secondary | ICD-10-CM | POA: Diagnosis not present

## 2017-01-02 DIAGNOSIS — Z0001 Encounter for general adult medical examination with abnormal findings: Secondary | ICD-10-CM | POA: Diagnosis not present

## 2017-01-02 DIAGNOSIS — I10 Essential (primary) hypertension: Secondary | ICD-10-CM | POA: Diagnosis not present

## 2017-02-12 DIAGNOSIS — M9902 Segmental and somatic dysfunction of thoracic region: Secondary | ICD-10-CM | POA: Diagnosis not present

## 2017-02-12 DIAGNOSIS — M9903 Segmental and somatic dysfunction of lumbar region: Secondary | ICD-10-CM | POA: Diagnosis not present

## 2017-02-12 DIAGNOSIS — M9901 Segmental and somatic dysfunction of cervical region: Secondary | ICD-10-CM | POA: Diagnosis not present

## 2017-02-12 DIAGNOSIS — M5137 Other intervertebral disc degeneration, lumbosacral region: Secondary | ICD-10-CM | POA: Diagnosis not present

## 2017-03-13 DIAGNOSIS — E291 Testicular hypofunction: Secondary | ICD-10-CM | POA: Diagnosis not present

## 2017-03-13 DIAGNOSIS — Z6841 Body Mass Index (BMI) 40.0 and over, adult: Secondary | ICD-10-CM | POA: Diagnosis not present

## 2017-03-13 DIAGNOSIS — K219 Gastro-esophageal reflux disease without esophagitis: Secondary | ICD-10-CM | POA: Diagnosis not present

## 2017-05-03 DIAGNOSIS — M9903 Segmental and somatic dysfunction of lumbar region: Secondary | ICD-10-CM | POA: Diagnosis not present

## 2017-05-03 DIAGNOSIS — M545 Low back pain: Secondary | ICD-10-CM | POA: Diagnosis not present

## 2017-05-03 DIAGNOSIS — M9905 Segmental and somatic dysfunction of pelvic region: Secondary | ICD-10-CM | POA: Diagnosis not present

## 2017-05-03 DIAGNOSIS — M9902 Segmental and somatic dysfunction of thoracic region: Secondary | ICD-10-CM | POA: Diagnosis not present

## 2017-05-21 DIAGNOSIS — M5137 Other intervertebral disc degeneration, lumbosacral region: Secondary | ICD-10-CM | POA: Diagnosis not present

## 2017-05-21 DIAGNOSIS — M9901 Segmental and somatic dysfunction of cervical region: Secondary | ICD-10-CM | POA: Diagnosis not present

## 2017-05-21 DIAGNOSIS — M9902 Segmental and somatic dysfunction of thoracic region: Secondary | ICD-10-CM | POA: Diagnosis not present

## 2017-05-21 DIAGNOSIS — M9903 Segmental and somatic dysfunction of lumbar region: Secondary | ICD-10-CM | POA: Diagnosis not present

## 2017-07-02 DIAGNOSIS — E1140 Type 2 diabetes mellitus with diabetic neuropathy, unspecified: Secondary | ICD-10-CM | POA: Diagnosis not present

## 2017-07-02 DIAGNOSIS — I10 Essential (primary) hypertension: Secondary | ICD-10-CM | POA: Diagnosis not present

## 2017-07-02 DIAGNOSIS — E119 Type 2 diabetes mellitus without complications: Secondary | ICD-10-CM | POA: Diagnosis not present

## 2017-07-02 DIAGNOSIS — E782 Mixed hyperlipidemia: Secondary | ICD-10-CM | POA: Diagnosis not present

## 2017-07-07 DIAGNOSIS — E119 Type 2 diabetes mellitus without complications: Secondary | ICD-10-CM | POA: Diagnosis not present

## 2017-11-13 DIAGNOSIS — M545 Low back pain: Secondary | ICD-10-CM | POA: Diagnosis not present

## 2017-11-13 DIAGNOSIS — M9905 Segmental and somatic dysfunction of pelvic region: Secondary | ICD-10-CM | POA: Diagnosis not present

## 2017-11-13 DIAGNOSIS — M9903 Segmental and somatic dysfunction of lumbar region: Secondary | ICD-10-CM | POA: Diagnosis not present

## 2017-11-13 DIAGNOSIS — M9902 Segmental and somatic dysfunction of thoracic region: Secondary | ICD-10-CM | POA: Diagnosis not present

## 2017-12-10 DIAGNOSIS — G4733 Obstructive sleep apnea (adult) (pediatric): Secondary | ICD-10-CM | POA: Diagnosis not present

## 2017-12-24 DIAGNOSIS — E119 Type 2 diabetes mellitus without complications: Secondary | ICD-10-CM | POA: Diagnosis not present

## 2017-12-24 DIAGNOSIS — I10 Essential (primary) hypertension: Secondary | ICD-10-CM | POA: Diagnosis not present

## 2017-12-24 DIAGNOSIS — E782 Mixed hyperlipidemia: Secondary | ICD-10-CM | POA: Diagnosis not present

## 2017-12-24 DIAGNOSIS — G4733 Obstructive sleep apnea (adult) (pediatric): Secondary | ICD-10-CM | POA: Diagnosis not present

## 2017-12-28 DIAGNOSIS — Z0000 Encounter for general adult medical examination without abnormal findings: Secondary | ICD-10-CM | POA: Diagnosis not present

## 2017-12-28 DIAGNOSIS — I10 Essential (primary) hypertension: Secondary | ICD-10-CM | POA: Diagnosis not present

## 2017-12-28 DIAGNOSIS — E782 Mixed hyperlipidemia: Secondary | ICD-10-CM | POA: Diagnosis not present

## 2017-12-28 DIAGNOSIS — E119 Type 2 diabetes mellitus without complications: Secondary | ICD-10-CM | POA: Diagnosis not present

## 2018-01-23 ENCOUNTER — Other Ambulatory Visit (HOSPITAL_COMMUNITY): Payer: Self-pay | Admitting: Internal Medicine

## 2018-01-23 DIAGNOSIS — R109 Unspecified abdominal pain: Secondary | ICD-10-CM

## 2018-01-29 ENCOUNTER — Ambulatory Visit (HOSPITAL_COMMUNITY)
Admission: RE | Admit: 2018-01-29 | Discharge: 2018-01-29 | Disposition: A | Discharge status: Home / Self Care | Payer: BLUE CROSS/BLUE SHIELD | Source: Ambulatory Visit | Attending: Internal Medicine | Admitting: Internal Medicine

## 2018-01-29 DIAGNOSIS — R109 Unspecified abdominal pain: Secondary | ICD-10-CM | POA: Insufficient documentation

## 2018-01-29 DIAGNOSIS — K760 Fatty (change of) liver, not elsewhere classified: Secondary | ICD-10-CM | POA: Insufficient documentation

## 2018-02-06 ENCOUNTER — Encounter: Payer: Self-pay | Admitting: Internal Medicine

## 2018-05-03 ENCOUNTER — Other Ambulatory Visit: Payer: Self-pay | Admitting: *Deleted

## 2018-05-03 ENCOUNTER — Ambulatory Visit (INDEPENDENT_AMBULATORY_CARE_PROVIDER_SITE_OTHER): Payer: BLUE CROSS/BLUE SHIELD | Admitting: Gastroenterology

## 2018-05-03 ENCOUNTER — Encounter: Payer: Self-pay | Admitting: *Deleted

## 2018-05-03 ENCOUNTER — Encounter: Payer: Self-pay | Admitting: Gastroenterology

## 2018-05-03 VITALS — BP 120/76 | HR 77 | Temp 97.2°F | Ht 70.0 in | Wt 267.4 lb

## 2018-05-03 DIAGNOSIS — R7989 Other specified abnormal findings of blood chemistry: Secondary | ICD-10-CM | POA: Insufficient documentation

## 2018-05-03 DIAGNOSIS — K760 Fatty (change of) liver, not elsewhere classified: Secondary | ICD-10-CM

## 2018-05-03 DIAGNOSIS — R112 Nausea with vomiting, unspecified: Secondary | ICD-10-CM | POA: Insufficient documentation

## 2018-05-03 DIAGNOSIS — R110 Nausea: Secondary | ICD-10-CM | POA: Diagnosis not present

## 2018-05-03 DIAGNOSIS — R945 Abnormal results of liver function studies: Secondary | ICD-10-CM

## 2018-05-03 DIAGNOSIS — K219 Gastro-esophageal reflux disease without esophagitis: Secondary | ICD-10-CM

## 2018-05-03 DIAGNOSIS — R1013 Epigastric pain: Secondary | ICD-10-CM

## 2018-05-03 DIAGNOSIS — R1012 Left upper quadrant pain: Secondary | ICD-10-CM

## 2018-05-03 DIAGNOSIS — R140 Abdominal distension (gaseous): Secondary | ICD-10-CM

## 2018-05-03 NOTE — Assessment & Plan Note (Signed)
Fatty liver on recent ultrasound.  Reportedly his AST was 44, ALT 53 although I do not have those results.  His risk factor includes diabetes, obesity.  He has been able to lose 25 pounds in the past 2 months.  He has made significant dietary changes.  We will plan on updating his labs and also do baseline labs for abnormal LFTs.  He plans to take his order to Dr. Scharlene GlossHall's office to get his labs drawn next month when he is there.

## 2018-05-03 NOTE — H&P (View-Only) (Signed)
Primary Care Physician:  Hall, John Z, MD  Primary Gastroenterologist:  Michael Rourk, MD   Chief Complaint  Patient presents with  . fatty liver  . Nausea    occurs approx q 6 wks  . Gas  . Abdominal Pain    HPI:  Derek Marsh is a 57 y.o. male here at the request of Dr. Hall for evaluation of nausea and fatty liver.  Patient complains of intermittent episodes occurring of the past 3 years which began with developing abdominal distention/bloating, nausea.  Develops malodorous belching and epigastric/left upper quadrant discomfort.  Ultimately he typically has vomiting.  He can go months between episodes.  At times he may have episodes several times a week.  His last episode was last week.  It has been about 8 weeks since his prior episode.  He has made significant dietary changes and has been able to lose 25 pounds since July 4.  Drinking water only with the exception of diet drink about twice per week.  In years past a lot of diet soft.  Denies any bowel concerns.  No melena or rectal bleeding.  He is not sure what triggers his symptoms.  He can wake up in the middle the night with him.  Typically will be unable to work for a couple of days.  He has tried Carafate.  He has been on Prevacid AcipHex over the years with his GERD.  Currently on Dexilant.  His last hemoglobin A1c was in the 5 range.  He has been on Celebrex daily for about 6 years.  Current Outpatient Medications  Medication Sig Dispense Refill  . aspirin 325 MG EC tablet Take 325 mg by mouth daily.      . b complex vitamins tablet Take 1 tablet by mouth daily.    . celecoxib (CELEBREX) 200 MG capsule Take 200 mg by mouth daily.    . cetirizine (ZYRTEC) 10 MG tablet Take 10 mg by mouth daily.      . Dapagliflozin-Metformin HCl ER 5-500 MG TB24 Take 5 mg by mouth 2 (two) times daily.    . desvenlafaxine (PRISTIQ) 50 MG 24 hr tablet Take 50 mg by mouth daily.    . DEXILANT 60 MG capsule Take 1 capsule by mouth daily.  2  .  Glucosamine-Chondroitin (GLUCOSAMINE CHONDR COMPLEX PO) Take 1 tablet by mouth daily.     . Liraglutide 18 MG/3ML SOPN Inject 1.8 mLs into the skin daily.    . mometasone (NASONEX) 50 MCG/ACT nasal spray Place 2 sprays into the nose daily.      . Multiple Vitamin (MULITIVITAMIN WITH MINERALS) TABS Take 1 tablet by mouth daily.    . niacin-simvastatin (SIMCOR) 1000-20 MG 24 hr tablet Take 1 tablet by mouth at bedtime.      . omega-3 acid ethyl esters (LOVAZA) 1 G capsule Take 3 g by mouth daily.     . OVER THE COUNTER MEDICATION CBD 25mg by mouth twice a day    . Probiotic Product (PROBIOTIC DAILY PO) Take by mouth daily.    . tadalafil (CIALIS) 20 MG tablet Take 20 mg by mouth daily as needed for erectile dysfunction.    . TURMERIC PO Take by mouth 2 (two) times daily.    . valsartan-hydrochlorothiazide (DIOVAN-HCT) 80-12.5 MG per tablet Take 1 tablet by mouth daily.    . RABEprazole (ACIPHEX) 20 MG tablet Take 20 mg by mouth daily.       No current facility-administered medications for this   visit.     Allergies as of 05/03/2018  . (No Known Allergies)    Past Medical History:  Diagnosis Date  . Depression   . Essential hypertension   . GERD (gastroesophageal reflux disease)   . Hyperlipidemia   . Low testosterone   . Obesity   . OSA (obstructive sleep apnea)   . Type 2 diabetes mellitus (HCC)   . Ventral hernia     Past Surgical History:  Procedure Laterality Date  . COLONOSCOPY  11/13/2011   Dr. Rourk: diverticulosis, hemorrhoids. next TCS 10/2021  . ESOPHAGOGASTRODUODENOSCOPY     Dr. Mann: says he had GERD    Family History  Problem Relation Age of Onset  . Diabetes Other   . Other Daughter        had subtotal colectomy for severe constipation  . Colon cancer Neg Hx   . Liver disease Neg Hx     Social History   Socioeconomic History  . Marital status: Married    Spouse name: Not on file  . Number of children: Not on file  . Years of education: Not on file  .  Highest education level: Not on file  Occupational History  . Occupation: Construction superintendent  Social Needs  . Financial resource strain: Not on file  . Food insecurity:    Worry: Not on file    Inability: Not on file  . Transportation needs:    Medical: Not on file    Non-medical: Not on file  Tobacco Use  . Smoking status: Former Smoker    Packs/day: 2.00    Years: 15.00    Pack years: 30.00    Types: Cigarettes    Start date: 08/24/1977    Last attempt to quit: 10/20/2006    Years since quitting: 11.5  . Smokeless tobacco: Never Used  Substance and Sexual Activity  . Alcohol use: Yes    Alcohol/week: 0.0 standard drinks    Comment: rare  . Drug use: Never  . Sexual activity: Not on file  Lifestyle  . Physical activity:    Days per week: Not on file    Minutes per session: Not on file  . Stress: Not on file  Relationships  . Social connections:    Talks on phone: Not on file    Gets together: Not on file    Attends religious service: Not on file    Active member of club or organization: Not on file    Attends meetings of clubs or organizations: Not on file    Relationship status: Not on file  . Intimate partner violence:    Fear of current or ex partner: Not on file    Emotionally abused: Not on file    Physically abused: Not on file    Forced sexual activity: Not on file  Other Topics Concern  . Not on file  Social History Narrative   2 cups of coffee per day   BS degree      ROS:  General: Negative for anorexia, unintentional weight loss, fever, chills, fatigue, weakness. Eyes: Negative for vision changes.  ENT: Negative for hoarseness, difficulty swallowing , nasal congestion. CV: Negative for chest pain, angina, palpitations, dyspnea on exertion, peripheral edema.  Respiratory: Negative for dyspnea at rest, dyspnea on exertion, cough, sputum, wheezing.  GI: See history of present illness. GU:  Negative for dysuria, hematuria, urinary  incontinence, urinary frequency, nocturnal urination.  MS: Negative for joint pain, low back pain.  Derm: Negative   for rash or itching.  Neuro: Negative for weakness, abnormal sensation, seizure, frequent headaches, memory loss, confusion.  Psych: Negative for anxiety, depression, suicidal ideation, hallucinations.  Endo: Negative for unusual weight change.  Heme: Negative for bruising or bleeding. Allergy: Negative for rash or hives.    Physical Examination:  BP 120/76   Pulse 77   Temp (!) 97.2 F (36.2 C) (Oral)   Ht 5' 10" (1.778 m)   Wt 267 lb 6.4 oz (121.3 kg)   BMI 38.37 kg/m    General: Well-nourished, well-developed in no acute distress.  Head: Normocephalic, atraumatic.   Eyes: Conjunctiva pink, no icterus. Mouth: Oropharyngeal mucosa moist and pink , no lesions erythema or exudate. Neck: Supple without thyromegaly, masses, or lymphadenopathy.  Lungs: Clear to auscultation bilaterally.  Heart: Regular rate and rhythm, no murmurs rubs or gallops.  Abdomen: Bowel sounds are normal, nontender, nondistended, no hepatosplenomegaly or masses, no abdominal bruits or    hernia , no rebound or guarding.   Rectal: not performed Extremities: No lower extremity edema. No clubbing or deformities.  Neuro: Alert and oriented x 4 , grossly normal neurologically.  Skin: Warm and dry, no rash or jaundice.   Psych: Alert and cooperative, normal mood and affect.  Labs: Unavailable.   Imaging Studies: No results found.   

## 2018-05-03 NOTE — Progress Notes (Signed)
Primary Care Physician:  Benita Stabile, MD  Primary Gastroenterologist:  Roetta Sessions, MD   Chief Complaint  Patient presents with  . fatty liver  . Nausea    occurs approx q 6 wks  . Gas  . Abdominal Pain    HPI:  Derek Marsh is a 57 y.o. male here at the request of Dr. Margo Aye for evaluation of nausea and fatty liver.  Patient complains of intermittent episodes occurring of the past 3 years which began with developing abdominal distention/bloating, nausea.  Develops malodorous belching and epigastric/left upper quadrant discomfort.  Ultimately he typically has vomiting.  He can go months between episodes.  At times he may have episodes several times a week.  His last episode was last week.  It has been about 8 weeks since his prior episode.  He has made significant dietary changes and has been able to lose 25 pounds since July 4.  Drinking water only with the exception of diet drink about twice per week.  In years past a lot of diet soft.  Denies any bowel concerns.  No melena or rectal bleeding.  He is not sure what triggers his symptoms.  He can wake up in the middle the night with him.  Typically will be unable to work for a couple of days.  He has tried Carafate.  He has been on Prevacid AcipHex over the years with his GERD.  Currently on Dexilant.  His last hemoglobin A1c was in the 5 range.  He has been on Celebrex daily for about 6 years.  Current Outpatient Medications  Medication Sig Dispense Refill  . aspirin 325 MG EC tablet Take 325 mg by mouth daily.      [name] Kitchen b complex vitamins tablet Take 1 tablet by mouth daily.    . celecoxib (CELEBREX) 200 MG capsule Take 200 mg by mouth daily.    . cetirizine (ZYRTEC) 10 MG tablet Take 10 mg by mouth daily.      . Dapagliflozin-Metformin HCl ER 5-500 MG TB24 Take 5 mg by mouth 2 (two) times daily.    [name] Kitchen desvenlafaxine (PRISTIQ) 50 MG 24 hr tablet Take 50 mg by mouth daily.    [name] Kitchen DEXILANT 60 MG capsule Take 1 capsule by mouth daily.  2  .  Glucosamine-Chondroitin (GLUCOSAMINE CHONDR COMPLEX PO) Take 1 tablet by mouth daily.     . Liraglutide 18 MG/3ML SOPN Inject 1.8 mLs into the skin daily.    . mometasone (NASONEX) 50 MCG/ACT nasal spray Place 2 sprays into the nose daily.      . Multiple Vitamin (MULITIVITAMIN WITH MINERALS) TABS Take 1 tablet by mouth daily.    . niacin-simvastatin (SIMCOR) 1000-20 MG 24 hr tablet Take 1 tablet by mouth at bedtime.      [name] Kitchen omega-3 acid ethyl esters (LOVAZA) 1 G capsule Take 3 g by mouth daily.     [name] Kitchen OVER THE COUNTER MEDICATION CBD 25mg  by mouth twice a day    . Probiotic Product (PROBIOTIC DAILY PO) Take by mouth daily.    . tadalafil (CIALIS) 20 MG tablet Take 20 mg by mouth daily as needed for erectile dysfunction.    . TURMERIC PO Take by mouth 2 (two) times daily.    . valsartan-hydrochlorothiazide (DIOVAN-HCT) 80-12.5 MG per tablet Take 1 tablet by mouth daily.    . RABEprazole (ACIPHEX) 20 MG tablet Take 20 mg by mouth daily.       No current facility-administered medications for this  visit.     Allergies as of 05/03/2018  . (No Known Allergies)    Past Medical History:  Diagnosis Date  . Depression   . Essential hypertension   . GERD (gastroesophageal reflux disease)   . Hyperlipidemia   . Low testosterone   . Obesity   . OSA (obstructive sleep apnea)   . Type 2 diabetes mellitus (HCC)   . Ventral hernia     Past Surgical History:  Procedure Laterality Date  . COLONOSCOPY  11/13/2011   Dr. Jena Gauss: diverticulosis, hemorrhoids. next TCS 10/2021  . ESOPHAGOGASTRODUODENOSCOPY     Dr. Loreta Ave: says he had GERD    Family History  Problem Relation Age of Onset  . Diabetes Other   . Other Daughter        had subtotal colectomy for severe constipation  . Colon cancer Neg Hx   . Liver disease Neg Hx     Social History   Socioeconomic History  . Marital status: Married    Spouse name: Not on file  . Number of children: Not on file  . Years of education: Not on file  .  Highest education level: Not on file  Occupational History  . Occupation: Programmer, multimedia  Social Needs  . Financial resource strain: Not on file  . Food insecurity:    Worry: Not on file    Inability: Not on file  . Transportation needs:    Medical: Not on file    Non-medical: Not on file  Tobacco Use  . Smoking status: Former Smoker    Packs/day: 2.00    Years: 15.00    Pack years: 30.00    Types: Cigarettes    Start date: 08/24/1977    Last attempt to quit: 10/20/2006    Years since quitting: 11.5  . Smokeless tobacco: Never Used  Substance and Sexual Activity  . Alcohol use: Yes    Alcohol/week: 0.0 standard drinks    Comment: rare  . Drug use: Never  . Sexual activity: Not on file  Lifestyle  . Physical activity:    Days per week: Not on file    Minutes per session: Not on file  . Stress: Not on file  Relationships  . Social connections:    Talks on phone: Not on file    Gets together: Not on file    Attends religious service: Not on file    Active member of club or organization: Not on file    Attends meetings of clubs or organizations: Not on file    Relationship status: Not on file  . Intimate partner violence:    Fear of current or ex partner: Not on file    Emotionally abused: Not on file    Physically abused: Not on file    Forced sexual activity: Not on file  Other Topics Concern  . Not on file  Social History Narrative   2 cups of coffee per day   BS degree      ROS:  General: Negative for anorexia, unintentional weight loss, fever, chills, fatigue, weakness. Eyes: Negative for vision changes.  ENT: Negative for hoarseness, difficulty swallowing , nasal congestion. CV: Negative for chest pain, angina, palpitations, dyspnea on exertion, peripheral edema.  Respiratory: Negative for dyspnea at rest, dyspnea on exertion, cough, sputum, wheezing.  GI: See history of present illness. GU:  Negative for dysuria, hematuria, urinary  incontinence, urinary frequency, nocturnal urination.  MS: Negative for joint pain, low back pain.  Derm: Negative  for rash or itching.  Neuro: Negative for weakness, abnormal sensation, seizure, frequent headaches, memory loss, confusion.  Psych: Negative for anxiety, depression, suicidal ideation, hallucinations.  Endo: Negative for unusual weight change.  Heme: Negative for bruising or bleeding. Allergy: Negative for rash or hives.    Physical Examination:  BP 120/76   Pulse 77   Temp (!) 97.2 F (36.2 C) (Oral)   Ht 5\' 10"  (1.778 m)   Wt 267 lb 6.4 oz (121.3 kg)   BMI 38.37 kg/m    General: Well-nourished, well-developed in no acute distress.  Head: Normocephalic, atraumatic.   Eyes: Conjunctiva pink, no icterus. Mouth: Oropharyngeal mucosa moist and pink , no lesions erythema or exudate. Neck: Supple without thyromegaly, masses, or lymphadenopathy.  Lungs: Clear to auscultation bilaterally.  Heart: Regular rate and rhythm, no murmurs rubs or gallops.  Abdomen: Bowel sounds are normal, nontender, nondistended, no hepatosplenomegaly or masses, no abdominal bruits or    hernia , no rebound or guarding.   Rectal: not performed Extremities: No lower extremity edema. No clubbing or deformities.  Neuro: Alert and oriented x 4 , grossly normal neurologically.  Skin: Warm and dry, no rash or jaundice.   Psych: Alert and cooperative, normal mood and affect.  Labs: Unavailable.   Imaging Studies: No results found.

## 2018-05-03 NOTE — Patient Instructions (Signed)
1. Upper endoscopy as scheduled. See separate instructions.  2. Please have your labs done with Dr. Margo AyeHall as discussed and have them forwarded to us as available. Call one week after you have labs done if you have not heard from our office about the results.    Fatty Liver Fatty liver, also called hepatic steatosis or steatohepatitis, is a condition in which too much fat has built up in your liver cells. The liver removes harmful substances from your bloodstream. It produces fluids your body needs. It also helps your body use and store energy from the food you eat. In many cases, fatty liver does not cause symptoms or problems. It is often diagnosed when tests are being done for other reasons. However, over time, fatty liver can cause inflammation that may lead to more serious liver problems, such as scarring of the liver (cirrhosis). What are the causes? Causes of fatty liver may include:  Drinking too much alcohol.  Poor nutrition.  Obesity.  Cushing syndrome.  Diabetes.  Hyperlipidemia.  Pregnancy.  Certain drugs.  Poisons.  Some viral infections.  What increases the risk? You may be more likely to develop fatty liver if you:  Abuse alcohol.  Are pregnant.  Are overweight.  Have diabetes.  Have hepatitis.  Have a high triglyceride level.  What are the signs or symptoms? Fatty liver often does not cause any symptoms. In cases where symptoms develop, they can include:  Fatigue.  Weakness.  Weight loss.  Confusion.  Abdominal pain.  Yellowing of your skin and the white parts of your eyes (jaundice).  Nausea and vomiting.  How is this diagnosed? Fatty liver may be diagnosed by:  Physical exam and medical history.  Blood tests.  Imaging tests, such as an ultrasound, CT scan, or MRI.  Liver biopsy. A small sample of liver tissue is removed using a needle. The sample is then looked at under a microscope.  How is this treated? Fatty liver is often  caused by other health conditions. Treatment for fatty liver may involve medicines and lifestyle changes to manage conditions such as:  Alcoholism.  High cholesterol.  Diabetes.  Being overweight or obese.  Follow these instructions at home:  Eat a healthy diet as directed by your health care provider.  Exercise regularly. This can help you lose weight and control your cholesterol and diabetes. Talk to your health care provider about an exercise plan and which activities are best for you.  Do not drink alcohol.  Take medicines only as directed by your health care provider. Contact a health care provider if: You have difficulty controlling your:  Blood sugar.  Cholesterol.  Alcohol consumption.  Get help right away if:  You have abdominal pain.  You have jaundice.  You have nausea and vomiting. This information is not intended to replace advice given to you by your health care provider. Make sure you discuss any questions you have with your health care provider. Document Released: 09/22/2005 Document Revised: 01/13/2016 Document Reviewed: 12/17/2013 Elsevier Interactive Patient Education  2018 Else[employer]   Nonalcoholic Fatty Liver Disease Diet Nonalcoholic fatty liver disease is a condition that causes fat to accumulate in and around the liver. The disease makes it harder for the liver to work the way that it should. Following a healthy diet can help to keep nonalcoholic fatty liver disease under control. It can also help to prevent or improve conditions that are associated with the disease, such as heart disease, diabetes, high blood pressure,  and abnormal cholesterol levels. Along with regular exercise, this diet:  Promotes weight loss.  Helps to control blood sugar levels.  Helps to improve the way that the body uses insulin.  What do I need to know about this diet?  Use the glycemic index (GI) to plan your meals. The index tells you how quickly a food will  raise your blood sugar. Choose low-GI foods. These foods take a longer time to raise blood sugar.  Keep track of how many calories you take in. Eating the right amount of calories will help you to achieve a healthy weight.  You may want to follow a Mediterranean diet. This diet includes a lot of vegetables, lean meats or fish, whole grains, fruits, and healthy oils and fats. What foods can I eat? Grains Whole grains, such as whole-wheat or whole-grain breads, crackers, tortillas, cereals, and pasta. Stone-ground whole wheat. Pumpernickel bread. Unsweetened oatmeal. Bulgur. Barley. Quinoa. Brown or wild rice. Corn or whole-wheat flour tortillas. Vegetables Lettuce. Spinach. Peas. Beets. Cauliflower. Cabbage. Broccoli. Carrots. Tomatoes. Squash. Eggplant. Herbs. Peppers. Onions. Cucumbers. Brussels sprouts. Yams and sweet potatoes. Beans. Lentils. Fruits Bananas. Apples. Oranges. Grapes. Papaya. Mango. Pomegranate. Kiwi. Grapefruit. Cherries. Meats and Other Protein Sources Seafood and shellfish. Lean meats. Poultry. Tofu. Dairy Low-fat or fat-free dairy products, such as yogurt, cottage cheese, and cheese. Beverages Water. Sugar-free drinks. Tea. Coffee. Low-fat or skim milk. Milk alternatives, such as soy or almond milk. Real fruit juice. Condiments Mustard. Relish. Low-fat, low-sugar ketchup and barbecue sauce. Low-fat or fat-free mayonnaise. Sweets and Desserts Sugar-free sweets. Fats and Oils Avocado. Canola or olive oil. Nuts and nut butters. Seeds. The items listed above may not be a complete list of recommended foods or beverages. Contact your dietitian for more options. What foods are not recommended? Palm oil and coconut oil. Processed foods. Fried foods. Sweetened drinks, such as sweet tea, milkshakes, snow cones, iced sweet drinks, and sodas. Alcohol. Sweets. Foods that contain a lot of salt or sodium. The items listed above may not be a complete list of foods and beverages to  avoid. Contact your dietitian for more information. This information is not intended to replace advice given to you by your health care provider. Make sure you discuss any questions you have with your health care provider. Document Released: 12/22/2014 Document Revised: 01/13/2016 Document Reviewed: 09/01/2014 Elsevier Interactive Patient Education  [employer].

## 2018-05-03 NOTE — Assessment & Plan Note (Signed)
57 year old gentleman with 3-year history of episodic upper abdominal discomfort associated with gas, bloating, nausea and vomiting.  Not necessarily postprandial.  Gallbladder unremarkable on recent ultrasound.  Symptoms not entirely classic for biliary etiology.  Less likely peptic ulcer disease given episodic symptoms.  He is on Celebrex daily.  PPI daily.  He has well-controlled diabetes, cannot exclude underlying gastroparesis.  This point would offer an upper endoscopy for further evaluation.  I have discussed the risks, alternatives, benefits with regards to but not limited to the risk of reaction to medication, bleeding, infection, perforation and the patient is agreeable to proceed. Written consent to be obtained.  If unremarkable he may need to complete gallbladder work-up with HIDA scan and/or CT abdomen/pelvis to rule out mechanical partial obstruction of the bowel during an episode.

## 2018-05-06 NOTE — Progress Notes (Signed)
CC'D TO PCP °

## 2018-05-22 ENCOUNTER — Encounter (HOSPITAL_COMMUNITY): Payer: Self-pay

## 2018-05-22 ENCOUNTER — Ambulatory Visit (HOSPITAL_COMMUNITY)
Admission: RE | Admit: 2018-05-22 | Discharge: 2018-05-22 | Disposition: A | Discharge status: Home / Self Care | Payer: BLUE CROSS/BLUE SHIELD | Source: Ambulatory Visit | Attending: Internal Medicine | Admitting: Internal Medicine

## 2018-05-22 ENCOUNTER — Other Ambulatory Visit: Payer: Self-pay

## 2018-05-22 ENCOUNTER — Encounter (HOSPITAL_COMMUNITY)
Admission: RE | Disposition: A | Discharge status: Home / Self Care | Payer: Self-pay | Source: Ambulatory Visit | Attending: Internal Medicine

## 2018-05-22 DIAGNOSIS — K760 Fatty (change of) liver, not elsewhere classified: Secondary | ICD-10-CM | POA: Diagnosis not present

## 2018-05-22 DIAGNOSIS — I10 Essential (primary) hypertension: Secondary | ICD-10-CM | POA: Diagnosis not present

## 2018-05-22 DIAGNOSIS — K219 Gastro-esophageal reflux disease without esophagitis: Secondary | ICD-10-CM | POA: Diagnosis not present

## 2018-05-22 DIAGNOSIS — M545 Low back pain: Secondary | ICD-10-CM | POA: Diagnosis not present

## 2018-05-22 DIAGNOSIS — Z87891 Personal history of nicotine dependence: Secondary | ICD-10-CM | POA: Diagnosis not present

## 2018-05-22 DIAGNOSIS — F329 Major depressive disorder, single episode, unspecified: Secondary | ICD-10-CM | POA: Insufficient documentation

## 2018-05-22 DIAGNOSIS — M9903 Segmental and somatic dysfunction of lumbar region: Secondary | ICD-10-CM | POA: Diagnosis not present

## 2018-05-22 DIAGNOSIS — Z6838 Body mass index (BMI) 38.0-38.9, adult: Secondary | ICD-10-CM | POA: Insufficient documentation

## 2018-05-22 DIAGNOSIS — K228 Other specified diseases of esophagus: Secondary | ICD-10-CM

## 2018-05-22 DIAGNOSIS — K2270 Barrett's esophagus without dysplasia: Secondary | ICD-10-CM | POA: Insufficient documentation

## 2018-05-22 DIAGNOSIS — E119 Type 2 diabetes mellitus without complications: Secondary | ICD-10-CM | POA: Diagnosis not present

## 2018-05-22 DIAGNOSIS — R1013 Epigastric pain: Secondary | ICD-10-CM

## 2018-05-22 DIAGNOSIS — M9905 Segmental and somatic dysfunction of pelvic region: Secondary | ICD-10-CM | POA: Diagnosis not present

## 2018-05-22 DIAGNOSIS — G4733 Obstructive sleep apnea (adult) (pediatric): Secondary | ICD-10-CM | POA: Insufficient documentation

## 2018-05-22 DIAGNOSIS — R140 Abdominal distension (gaseous): Secondary | ICD-10-CM

## 2018-05-22 DIAGNOSIS — E669 Obesity, unspecified: Secondary | ICD-10-CM | POA: Diagnosis not present

## 2018-05-22 DIAGNOSIS — M9902 Segmental and somatic dysfunction of thoracic region: Secondary | ICD-10-CM | POA: Diagnosis not present

## 2018-05-22 DIAGNOSIS — E785 Hyperlipidemia, unspecified: Secondary | ICD-10-CM | POA: Insufficient documentation

## 2018-05-22 DIAGNOSIS — Z7982 Long term (current) use of aspirin: Secondary | ICD-10-CM | POA: Insufficient documentation

## 2018-05-22 DIAGNOSIS — K449 Diaphragmatic hernia without obstruction or gangrene: Secondary | ICD-10-CM | POA: Insufficient documentation

## 2018-05-22 DIAGNOSIS — R112 Nausea with vomiting, unspecified: Secondary | ICD-10-CM

## 2018-05-22 HISTORY — PX: BIOPSY: SHX5522

## 2018-05-22 HISTORY — PX: ESOPHAGOGASTRODUODENOSCOPY: SHX5428

## 2018-05-22 LAB — GLUCOSE, CAPILLARY: Glucose-Capillary: 78 mg/dL (ref 70–99)

## 2018-05-22 SURGERY — EGD (ESOPHAGOGASTRODUODENOSCOPY)
Anesthesia: Moderate Sedation

## 2018-05-22 MED ORDER — LIDOCAINE VISCOUS HCL 2 % MT SOLN
OROMUCOSAL | Status: DC | PRN
  Administered 2018-05-22: 1 via OROMUCOSAL

## 2018-05-22 MED ORDER — ONDANSETRON HCL 4 MG/2ML IJ SOLN
INTRAMUSCULAR | Status: DC | PRN
  Administered 2018-05-22: 4 mg via INTRAVENOUS

## 2018-05-22 MED ORDER — MEPERIDINE HCL 100 MG/ML IJ SOLN
INTRAMUSCULAR | Status: DC | PRN
  Administered 2018-05-22: 15 mg via INTRAVENOUS
  Administered 2018-05-22: 25 mg via INTRAVENOUS

## 2018-05-22 MED ORDER — SODIUM CHLORIDE 0.9 % IV SOLN: INTRAVENOUS | Status: DC

## 2018-05-22 MED ORDER — MIDAZOLAM HCL 5 MG/5ML IJ SOLN
INTRAMUSCULAR | Status: DC | PRN
  Administered 2018-05-22 (×2): 1 mg via INTRAVENOUS
  Administered 2018-05-22: 2 mg via INTRAVENOUS

## 2018-05-22 MED ORDER — LIDOCAINE VISCOUS HCL 2 % MT SOLN
OROMUCOSAL | Status: AC
  Filled 2018-05-22: qty 15

## 2018-05-22 MED ORDER — MIDAZOLAM HCL 5 MG/5ML IJ SOLN
INTRAMUSCULAR | Status: AC
  Filled 2018-05-22: qty 10

## 2018-05-22 MED ORDER — ONDANSETRON HCL 4 MG/2ML IJ SOLN
INTRAMUSCULAR | Status: AC
  Filled 2018-05-22: qty 2

## 2018-05-22 MED ORDER — MEPERIDINE HCL 50 MG/ML IJ SOLN
INTRAMUSCULAR | Status: AC
  Filled 2018-05-22: qty 1

## 2018-05-22 NOTE — Discharge Instructions (Signed)
EGD Discharge instructions Please read the instructions outlined below and refer to this sheet in the next few weeks. These discharge instructions provide you with general information on caring for yourself after you leave the hospital. Your doctor may also give you specific instructions. While your treatment has been planned according to the most current medical practices available, unavoidable complications occasionally occur. If you have any problems or questions after discharge, please call your doctor. ACTIVITY  You may resume your regular activity but move at a slower pace for the next 24 hours.   Take frequent rest periods for the next 24 hours.   Walking will help expel (get rid of) the air and reduce the bloated feeling in your abdomen.   No driving for 24 hours (because of the anesthesia (medicine) used during the test).   You may shower.   Do not sign any important legal documents or operate any machinery for 24 hours (because of the anesthesia used during the test).  NUTRITION  Drink plenty of fluids.   You may resume your normal diet.   Begin with a light meal and progress to your normal diet.   Avoid alcoholic beverages for 24 hours or as instructed by your caregiver.  MEDICATIONS  You may resume your normal medications unless your caregiver tells you otherwise.  WHAT YOU CAN EXPECT TODAY  You may experience abdominal discomfort such as a feeling of fullness or gas pains.  FOLLOW-UP  Your doctor will discuss the results of your test with you.  SEEK IMMEDIATE MEDICAL ATTENTION IF ANY OF THE FOLLOWING OCCUR:  Excessive nausea (feeling sick to your stomach) and/or vomiting.   Severe abdominal pain and distention (swelling).   Trouble swallowing.   Temperature over 101 F (37.8 C).   Rectal bleeding or vomiting of blood.    GERD information provided  Continue Dexilant 60 mg daily  Continue a good job with weight loss and diet; strive to lose another 25  pounds over the next 6 months  We will go ahead and proceed on with a HIDA with CCK to further evaluate your gallbladder  Office visit with [place] in 2 months  Further recommendations to follow pending review of pathology report   Gastroesophageal Reflux Disease, Adult Normally, food travels down the esophagus and stays in the stomach to be digested. However, when a person has gastroesophageal reflux disease (GERD), food and stomach acid move back up into the esophagus. When this happens, the esophagus becomes sore and inflamed. Over time, GERD can create small holes (ulcers) in the lining of the esophagus. What are the causes? This condition is caused by a problem with the muscle between the esophagus and the stomach (lower esophageal sphincter, or LES). Normally, the LES muscle closes after food passes through the esophagus to the stomach. When the LES is weakened or abnormal, it does not close properly, and that allows food and stomach acid to go back up into the esophagus. The LES can be weakened by certain dietary substances, medicines, and medical conditions, including:  Tobacco use.  Pregnancy.  Having a hiatal hernia.  Heavy alcohol use.  Certain foods and beverages, such as coffee, chocolate, onions, and peppermint.  What increases the risk? This condition is more likely to develop in:  People who have an increased body weight.  People who have connective tissue disorders.  People who use NSAID medicines.  What are the signs or symptoms? Symptoms of this condition include:  Heartburn.  Difficult or painful swallowing.  The feeling of having a lump in the throat.  Abitter taste in the mouth.  Bad breath.  Having a large amount of saliva.  Having an upset or bloated stomach.  Belching.  Chest pain.  Shortness of breath or wheezing.  Ongoing (chronic) cough or a night-time cough.  Wearing away of tooth enamel.  Weight loss.  Different conditions can  cause chest pain. Make sure to see your health care provider if you experience chest pain. How is this diagnosed? Your health care provider will take a medical history and perform a physical exam. To determine if you have mild or severe GERD, your health care provider may also monitor how you respond to treatment. You may also have other tests, including:  An endoscopy toexamine your stomach and esophagus with a small camera.  A test thatmeasures the acidity level in your esophagus.  A test thatmeasures how much pressure is on your esophagus.  A barium swallow or modified barium swallow to show the shape, size, and functioning of your esophagus.  How is this treated? The goal of treatment is to help relieve your symptoms and to prevent complications. Treatment for this condition may vary depending on how severe your symptoms are. Your health care provider may recommend:  Changes to your diet.  Medicine.  Surgery.  Follow these instructions at home: Diet  Follow a diet as recommended by your health care provider. This may involve avoiding foods and drinks such as: ? Coffee and tea (with or without caffeine). ? Drinks that containalcohol. ? Energy drinks and sports drinks. ? Carbonated drinks or sodas. ? Chocolate and cocoa. ? Peppermint and mint flavorings. ? Garlic and onions. ? Horseradish. ? Spicy and acidic foods, including peppers, chili powder, curry powder, vinegar, hot sauces, and barbecue sauce. ? Citrus fruit juices and citrus fruits, such as oranges, lemons, and limes. ? Tomato-based foods, such as red sauce, chili, salsa, and pizza with red sauce. ? Fried and fatty foods, such as donuts, french fries, potato chips, and high-fat dressings. ? High-fat meats, such as hot dogs and fatty cuts of red and white meats, such as rib eye steak, sausage, ham, and bacon. ? High-fat dairy items, such as whole milk, butter, and cream cheese.  Eat small, frequent meals instead  of large meals.  Avoid drinking large amounts of liquid with your meals.  Avoid eating meals during the 2-3 hours before bedtime.  Avoid lying down right after you eat.  Do not exercise right after you eat. General instructions  Pay attention to any changes in your symptoms.  Take over-the-counter and prescription medicines only as told by your health care provider. Do not take aspirin, ibuprofen, or other NSAIDs unless your health care provider told you to do so.  Do not use any tobacco products, including cigarettes, chewing tobacco, and e-cigarettes. If you need help quitting, ask your health care provider.  Wear loose-fitting clothing. Do not wear anything tight around your waist that causes pressure on your abdomen.  Raise (elevate) the head of your bed 6 inches (15cm).  Try to reduce your stress, such as with yoga or meditation. If you need help reducing stress, ask your health care provider.  If you are overweight, reduce your weight to an amount that is healthy for you. Ask your health care provider for guidance about a safe weight loss goal.  Keep all follow-up visits as told by your health care provider. This is important. Contact a health care provider if:  You have new symptoms.  You have unexplained weight loss.  You have difficulty swallowing, or it hurts to swallow.  You have wheezing or a persistent cough.  Your symptoms do not improve with treatment.  You have a hoarse voice. Get help right away if:  You have pain in your arms, neck, jaw, teeth, or back.  You feel sweaty, dizzy, or light-headed.  You have chest pain or shortness of breath.  You vomit and your vomit looks like blood or coffee grounds.  You faint.  Your stool is bloody or black.  You cannot swallow, drink, or eat. This information is not intended to replace advice given to you by your health care provider. Make sure you discuss any questions you have with your health care  provider. Document Released: 05/17/2005 Document Revised: 01/05/2016 Document Reviewed: 12/02/2014 Elsevier Interactive Patient Education  [employer].

## 2018-05-22 NOTE — OR Nursing (Signed)
Derek Marsh was at [facility] on 05/22/18 for a procedure and cannot return to work until 3:30pm on 05/23/18.

## 2018-05-22 NOTE — Interval H&P Note (Signed)
History and Physical Interval Note:  05/22/2018 2:22 PM  Derek Marsh  has presented today for surgery, with the diagnosis of epigastric pain, nausea, vomiting, bloating  The various methods of treatment have been discussed with the patient and family. After consideration of risks, benefits and other options for treatment, the patient has consented to  Procedure(s) with comments: ESOPHAGOGASTRODUODENOSCOPY (EGD) (N/A) - 2:15pm as a surgical intervention .  The patient's history has been reviewed, patient examined, no change in status, stable for surgery.  I have reviewed the patient's chart and labs.  Questions were answered to the patient's satisfaction.     Derek Marsh  Patient denies dysphagia.  Diagnostic EGD today per plan.  The risks, benefits, limitations, alternatives and imponderables have been reviewed with the patient. Potential for esophageal dilation, biopsy, etc. have also been reviewed.  Questions have been answered. All parties agreeable.

## 2018-05-22 NOTE — Op Note (Signed)
[facility] Patient Name: Derek Marsh Procedure Date: 05/22/2018 2:10 PM MRN: [id number] Date of Birth: Jan 30, 1961 Attending MD: Gennette Pac , MD CSN: [id number] Age: 57 Admit Type: Outpatient Procedure:                Upper GI endoscopy Indications:              Dyspepsia Providers:                Gennette Pac, MD, Nena Polio, RN, Dyann Ruddle Referring MD:              Medicines:                Midazolam 4 mg IV, Meperidine 40 mg IV, Ondansetron                            4 mg IV Complications:            No immediate complications. Estimated Blood Loss:     Estimated blood loss was minimal. Procedure:                Pre-Anesthesia Assessment:                           - Prior to the procedure, a History and Physical                            was performed, and patient medications and                            allergies were reviewed. The patient's tolerance of                            previous anesthesia was also reviewed. The risks                            and benefits of the procedure and the sedation                            options and risks were discussed with the patient.                            All questions were answered, and informed consent                            was obtained. Prior Anticoagulants: The patient has                            taken no previous anticoagulant or antiplatelet                            agents. ASA Grade Assessment: II - A patient with  mild systemic disease. After reviewing the risks                            and benefits, the patient was deemed in                            satisfactory condition to undergo the procedure.                           After obtaining informed consent, the endoscope was                            passed under direct vision. Throughout the                            procedure, the patient's blood pressure, pulse, and                      oxygen saturations were monitored continuously. The                            [id number]-[id number] ([id number]) scope was introduced through the                            mouth, and advanced to the second part of duodenum.                            The upper GI endoscopy was accomplished without                            difficulty. The patient tolerated the procedure                            well. Scope In: 2:37:20 PM Scope Out: 2:43:01 PM Total Procedure Duration: 0 hours 5 minutes 41 seconds  Findings:      Mucosal changes were found at the gastroesophageal junction. (2) skinny       tongues of salmon-colored epithelium coming up above the GE junction. No       nodularity. No esophagitis. This was biopsied with a cold forceps for       histology. Estimated blood loss was minimal.      A small hiatal hernia was present.      The exam was otherwise without abnormality.      The duodenal bulb and second portion of the duodenum were normal. Impression:               - Mucosal changes in the esophagus. Biopsied.                           - Small hiatal hernia.                           - The examination was otherwise normal.                           - Normal duodenal bulb and second  portion of the                            duodenum. Moderate Sedation:      Moderate (conscious) sedation was administered by the endoscopy nurse       and supervised by the endoscopist. The following parameters were       monitored: oxygen saturation, heart rate, blood pressure, respiratory       rate, EKG, adequacy of pulmonary ventilation, and response to care.       Total physician intraservice time was 14 minutes. Recommendation:           - Patient has a contact number available for                            emergencies. The signs and symptoms of potential                            delayed complications were discussed with the                            patient. Return to normal activities  tomorrow.                            Written discharge instructions were provided to the                            patient.                           - Resume previous diet.                           - Continue present medications. Continue Dexilant                            60 mg daily. Continue good job with diet and weight                            loss. We will proceed with a HIDA with CCK                            challenge to further evaluate dyspeptic symptoms.                           - Repeat upper endoscopy (date not yet determined)                            for surveillance based on pathology results.                           - Return to GI clinic in 2 months. Procedure Code(s):        --- Professional ---                           [phone], Esophagogastroduodenoscopy, flexible,  transoral; with biopsy, single or multiple                           G0500, Moderate sedation services provided by the                            same physician or other qualified health care                            professional performing a gastrointestinal                            endoscopic service that sedation supports,                            requiring the presence of an independent trained                            observer to assist in the monitoring of the                            patient's level of consciousness and physiological                            status; initial 15 minutes of intra-service time;                            patient age 35 years or older (additional time may                            be reported with 16109, as appropriate) Diagnosis Code(s):        --- Professional ---                           K22.8, Other specified diseases of esophagus                           K44.9, Diaphragmatic hernia without obstruction or                            gangrene                           R10.13, Epigastric pain CPT copyright 2017 American  Medical Association. All rights reserved. The codes documented in this report are preliminary and upon coder review may  be revised to meet current compliance requirements. Gerrit Friends. Noel Rodier, MD Gennette Pac, MD 05/22/2018 2:47:57 PM This report has been signed electronically. Number of Addenda: 0

## 2018-05-26 ENCOUNTER — Encounter: Payer: Self-pay | Admitting: Internal Medicine

## 2018-05-27 ENCOUNTER — Encounter (HOSPITAL_COMMUNITY): Payer: Self-pay | Admitting: Internal Medicine

## 2018-05-27 DIAGNOSIS — E782 Mixed hyperlipidemia: Secondary | ICD-10-CM | POA: Diagnosis not present

## 2018-05-27 DIAGNOSIS — E291 Testicular hypofunction: Secondary | ICD-10-CM | POA: Diagnosis not present

## 2018-05-27 DIAGNOSIS — K760 Fatty (change of) liver, not elsewhere classified: Secondary | ICD-10-CM | POA: Diagnosis not present

## 2018-05-27 DIAGNOSIS — I10 Essential (primary) hypertension: Secondary | ICD-10-CM | POA: Diagnosis not present

## 2018-05-27 DIAGNOSIS — E119 Type 2 diabetes mellitus without complications: Secondary | ICD-10-CM | POA: Diagnosis not present

## 2018-05-31 DIAGNOSIS — E1169 Type 2 diabetes mellitus with other specified complication: Secondary | ICD-10-CM | POA: Diagnosis not present

## 2018-05-31 DIAGNOSIS — I10 Essential (primary) hypertension: Secondary | ICD-10-CM | POA: Diagnosis not present

## 2018-05-31 DIAGNOSIS — Z23 Encounter for immunization: Secondary | ICD-10-CM | POA: Diagnosis not present

## 2018-05-31 DIAGNOSIS — E782 Mixed hyperlipidemia: Secondary | ICD-10-CM | POA: Diagnosis not present

## 2018-07-26 ENCOUNTER — Ambulatory Visit (INDEPENDENT_AMBULATORY_CARE_PROVIDER_SITE_OTHER): Payer: BLUE CROSS/BLUE SHIELD | Admitting: Internal Medicine

## 2018-07-26 ENCOUNTER — Encounter: Payer: Self-pay | Admitting: Internal Medicine

## 2018-07-26 ENCOUNTER — Other Ambulatory Visit: Payer: Self-pay

## 2018-07-26 VITALS — BP 108/68 | HR 85 | Temp 97.2°F | Ht 70.0 in | Wt 255.4 lb

## 2018-07-26 DIAGNOSIS — R1013 Epigastric pain: Secondary | ICD-10-CM

## 2018-07-26 DIAGNOSIS — K2270 Barrett's esophagus without dysplasia: Secondary | ICD-10-CM

## 2018-07-26 DIAGNOSIS — K219 Gastro-esophageal reflux disease without esophagitis: Secondary | ICD-10-CM | POA: Diagnosis not present

## 2018-07-26 DIAGNOSIS — R109 Unspecified abdominal pain: Secondary | ICD-10-CM

## 2018-07-26 NOTE — Patient Instructions (Signed)
Continue Dexilant 60 mg daily  Will proceed with a HIDA / CCK challenge - postprandial abdominal discomfort  Further recommendations to follow

## 2018-07-26 NOTE — Progress Notes (Signed)
Primary Care Physician:  Benita Stabile, MD Primary Gastroenterologist:  Dr. Jena Gauss  Pre-Procedure History & Physical: HPI:  Derek Marsh is a 57 y.o. male here for f/u of GERD/short segment Barrett's esophagus.  Doing well on Dexilant 60 mg daily.  Continues to have intermittent postprandial upper abdominal pain, bloating and occasional nausea.  Gallbladder ultrasound negative.  HIDA not yet done.  Past Medical History:  Diagnosis Date  . Depression   . Essential hypertension   . GERD (gastroesophageal reflux disease)   . Hyperlipidemia   . Low testosterone   . Obesity   . OSA (obstructive sleep apnea)    uses CPAP at 13  . Type 2 diabetes mellitus (HCC)   . Ventral hernia     Past Surgical History:  Procedure Laterality Date  . BIOPSY  05/22/2018   Procedure: BIOPSY;  Surgeon: Corbin Ade, MD;  Location: [facility];  Service: Endoscopy;;  distal,esophagus  . COLONOSCOPY  11/13/2011   Dr. Jena Gauss: diverticulosis, hemorrhoids. next TCS 10/2021  . ESOPHAGOGASTRODUODENOSCOPY     Dr. Loreta Ave: says he had GERD  . ESOPHAGOGASTRODUODENOSCOPY N/A 05/22/2018   Procedure: ESOPHAGOGASTRODUODENOSCOPY (EGD);  Surgeon: Corbin Ade, MD;  Location: [facility];  Service: Endoscopy;  Laterality: N/A;  2:15pm    Prior to Admission medications   Medication Sig Start Date End Date Taking? Authorizing Provider  aspirin 325 MG EC tablet Take 325 mg by mouth at bedtime.    Yes [provider]  b complex vitamins tablet Take 1 tablet by mouth daily.   Yes [provider]  celecoxib (CELEBREX) 100 MG capsule Take 100 mg by mouth 2 (two) times daily.   Yes [provider]  cetirizine (ZYRTEC) 10 MG tablet Take 10 mg by mouth at bedtime.    Yes [provider]  Dapagliflozin-Metformin HCl ER 5-500 MG TB24 Take 5 mg by mouth 2 (two) times daily.    Yes [provider]  desvenlafaxine (PRISTIQ) 50 MG 24 hr tablet Take 50 mg by mouth every evening.     Yes [provider]  DEXILANT 60 MG capsule Take 60 mg by mouth at bedtime.  02/14/18  Yes [provider]  diphenhydrAMINE (BENADRYL) 25 MG tablet Take 25 mg by mouth daily as needed for allergies.   Yes [provider]  GLUCOS-CHONDROIT-MSM-C-HYAL PO Take 2 tablets by mouth at bedtime.   Yes [provider]  Liraglutide 18 MG/3ML SOPN Inject 1.8 mg into the skin at bedtime.    Yes [provider]  mometasone (NASONEX) 50 MCG/ACT nasal spray Place 2 sprays into the nose at bedtime.    Yes [provider]  Multiple Vitamin (MULITIVITAMIN WITH MINERALS) TABS Take 1 tablet by mouth at bedtime.    Yes [provider]  niacin 500 MG tablet Take 500 mg by mouth every evening.   Yes [provider]  omega-3 acid ethyl esters (LOVAZA) 1 G capsule Take 3 g by mouth every evening.    Yes [provider]  OVER THE COUNTER MEDICATION Take 25 mg by mouth 2 (two) times daily. CBD   Yes [provider]  Probiotic Product (PROBIOTIC DAILY PO) Take 1 capsule by mouth at bedtime.    Yes [provider]  simvastatin (ZOCOR) 20 MG tablet Take 20 mg by mouth at bedtime.   Yes [provider]  tadalafil (CIALIS) 20 MG tablet Take 20 mg by mouth daily as needed for erectile  dysfunction.   Yes [provider]  Testosterone 10 MG/ACT (2%) GEL Apply 4 Pump topically at bedtime. 04/23/18  Yes [provider]  TURMERIC PO Take 1,400 mg by mouth at bedtime.    Yes [provider]  valsartan-hydrochlorothiazide (DIOVAN-HCT) 80-12.5 MG per tablet Take 1 tablet by mouth at bedtime.    Yes [provider]    Allergies as of 07/26/2018  . (No Known Allergies)    Family History  Problem Relation Age of Onset  . Diabetes Other   . Other Daughter        had subtotal colectomy for severe constipation  . Colon cancer Neg Hx   . Liver disease Neg Hx     Social History   Socioeconomic  History  . Marital status: Married    Spouse name: Candace   . Number of children: Not on file  . Years of education: Not on file  . Highest education level: Bachelor's degree (e.g., BA, AB, BS)  Occupational History  . Occupation: Programmer, multimediaConstruction superintendent  Social Needs  . Financial resource strain: Not on file  . Food insecurity:    Worry: Not on file    Inability: Not on file  . Transportation needs:    Medical: Not on file    Non-medical: Not on file  Tobacco Use  . Smoking status: Former Smoker    Packs/day: 2.00    Years: 15.00    Pack years: 30.00    Types: Cigarettes    Start date: 08/24/1977    Last attempt to quit: 10/20/2006    Years since quitting: 11.7  . Smokeless tobacco: Never Used  Substance and Sexual Activity  . Alcohol use: Yes    Alcohol/week: 0.0 standard drinks    Comment: rare  . Drug use: Never  . Sexual activity: Yes  Lifestyle  . Physical activity:    Days per week: Not on file    Minutes per session: Not on file  . Stress: Not on file  Relationships  . Social connections:    Talks on phone: Not on file    Gets together: Not on file    Attends religious service: Not on file    Active member of club or organization: Not on file    Attends meetings of clubs or organizations: Not on file    Relationship status: Not on file  . Intimate partner violence:    Fear of current or ex partner: Not on file    Emotionally abused: Not on file    Physically abused: Not on file    Forced sexual activity: Not on file  Other Topics Concern  . Not on file  Social History Narrative   2 cups of coffee per day   BS degree    Review of Systems: See HPI, otherwise negative ROS  Physical Exam: BP 108/68   Pulse 85   Temp (!) 97.2 F (36.2 C) (Oral)   Ht 5\' 10"  (1.778 m)   Wt 255 lb 6.4 oz (115.8 kg)   BMI 36.65 kg/m  General:   Alert,   pleasant and cooperative in NAD Neck:  Supple; no masses or thyromegaly. No significant cervical  adenopathy. Lungs:  Clear throughout to auscultation.   No wheezes, crackles, or rhonchi. No acute distress. Heart:  Regular rate and rhythm; no murmurs, clicks, rubs,  or gallops. Abdomen: Non-distended, normal bowel sounds.  Soft and nontender without appreciable mass or hepatosplenomegaly.  Pulses:  Normal pulses noted. Extremities:  Without clubbing or edema.  Impression/Plan: GERD/short segment Barrett's esophagus.  Symptoms well controlled on Dexilant 60 mg daily.  Postprandial epigastric pain with nausea only occasional.  Somewhat suspicious of underlying gallbladder disease.  Gallbladder appeared normal sonographically recently.   Recommendations:Continue Dexilant 60 mg daily  Will proceed with a HIDA / CCK challenge - postprandial abdominal discomfort  Further recommendations to follow   Notice: This dictation was prepared with Dragon dictation along with smaller phrase technology. Any transcriptional errors that result from this process are unintentional and may not be corrected upon review.

## 2018-07-29 NOTE — Patient Instructions (Signed)
Called [employer], no PA needed for HIDA scan. Ref# [id number].

## 2018-08-05 ENCOUNTER — Encounter (HOSPITAL_COMMUNITY): Payer: Self-pay

## 2018-08-05 ENCOUNTER — Encounter (HOSPITAL_COMMUNITY)
Admission: RE | Admit: 2018-08-05 | Discharge: 2018-08-05 | Disposition: A | Discharge status: Home / Self Care | Payer: BLUE CROSS/BLUE SHIELD | Source: Ambulatory Visit | Attending: Internal Medicine | Admitting: Internal Medicine

## 2018-08-05 DIAGNOSIS — R197 Diarrhea, unspecified: Secondary | ICD-10-CM | POA: Diagnosis not present

## 2018-08-05 DIAGNOSIS — R109 Unspecified abdominal pain: Secondary | ICD-10-CM

## 2018-08-05 MED ORDER — TECHNETIUM TC 99M MEBROFENIN IV KIT
5.0000 | PACK | Freq: Once | INTRAVENOUS | Status: AC | PRN
  Administered 2018-08-05: 5 via INTRAVENOUS

## 2018-08-12 ENCOUNTER — Encounter: Payer: Self-pay | Admitting: Internal Medicine

## 2018-08-12 NOTE — Progress Notes (Signed)
PATIENT SCHEDULED AND LETTER SENT  °

## 2018-10-07 DIAGNOSIS — E1169 Type 2 diabetes mellitus with other specified complication: Secondary | ICD-10-CM | POA: Diagnosis not present

## 2018-10-07 DIAGNOSIS — I10 Essential (primary) hypertension: Secondary | ICD-10-CM | POA: Diagnosis not present

## 2018-10-07 DIAGNOSIS — E119 Type 2 diabetes mellitus without complications: Secondary | ICD-10-CM | POA: Diagnosis not present

## 2018-10-07 DIAGNOSIS — E782 Mixed hyperlipidemia: Secondary | ICD-10-CM | POA: Diagnosis not present

## 2018-10-10 DIAGNOSIS — E782 Mixed hyperlipidemia: Secondary | ICD-10-CM | POA: Diagnosis not present

## 2018-10-10 DIAGNOSIS — E1169 Type 2 diabetes mellitus with other specified complication: Secondary | ICD-10-CM | POA: Diagnosis not present

## 2018-10-10 DIAGNOSIS — G4730 Sleep apnea, unspecified: Secondary | ICD-10-CM | POA: Diagnosis not present

## 2018-10-10 DIAGNOSIS — I10 Essential (primary) hypertension: Secondary | ICD-10-CM | POA: Diagnosis not present

## 2018-10-28 ENCOUNTER — Ambulatory Visit (INDEPENDENT_AMBULATORY_CARE_PROVIDER_SITE_OTHER): Payer: BLUE CROSS/BLUE SHIELD | Admitting: Gastroenterology

## 2018-10-28 ENCOUNTER — Encounter: Payer: Self-pay | Admitting: Gastroenterology

## 2018-10-28 VITALS — BP 127/80 | HR 85 | Temp 97.3°F | Ht 70.0 in | Wt 269.6 lb

## 2018-10-28 DIAGNOSIS — R140 Abdominal distension (gaseous): Secondary | ICD-10-CM

## 2018-10-28 DIAGNOSIS — R112 Nausea with vomiting, unspecified: Secondary | ICD-10-CM

## 2018-10-28 DIAGNOSIS — R1012 Left upper quadrant pain: Secondary | ICD-10-CM | POA: Diagnosis not present

## 2018-10-28 DIAGNOSIS — K219 Gastro-esophageal reflux disease without esophagitis: Secondary | ICD-10-CM | POA: Diagnosis not present

## 2018-10-28 DIAGNOSIS — K760 Fatty (change of) liver, not elsewhere classified: Secondary | ICD-10-CM | POA: Diagnosis not present

## 2018-10-28 NOTE — Patient Instructions (Signed)
1. Call next time you have an episode of abdominal pain, gas/bloating/nausea. We will order a CT while you are having symptoms to see if we can "catch" a partial obstruction/mechanical problem.  2. Please have your labs done to screen for celiac disease.  3. I will review recent labs from PCP.

## 2018-10-28 NOTE — Assessment & Plan Note (Signed)
58 year old man with 4-year history of episodic left upper quadrant abdominal pain associated with gas, bloating, nausea and sometimes vomiting.  EGD, gallbladder work-up unremarkable as outlined except for Barrett's.  Last colonoscopy 58 years old, diverticulosis and hemorrhoids.  Differential diagnosis includes intermittent mechanical partial bowel obstruction, underlying gastroparesis, less likely colon source.  Patient currently asymptomatic.  With his next episode would recommend CT abdomen pelvis with contrast stat in order to help figure out the source of his symptoms, rule out mechanical process.  We will also screen for celiac disease.  Further recommendations to follow.

## 2018-10-28 NOTE — Assessment & Plan Note (Signed)
Continue Dexilant 60 mg daily. With h/o Barrett's, plans for EGD 2022.

## 2018-10-28 NOTE — Assessment & Plan Note (Signed)
F/u on recent labs.   Instructions for fatty liver: Recommend 1-2# weight loss per week until ideal body weight through exercise & diet. Low fat/cholesterol diet.   Avoid sweets, sodas, fruit juices, sweetened beverages like tea, etc. Gradually increase exercise from 15 min daily up to 1 hr per day 5 days/week. Limit alcohol use.

## 2018-10-28 NOTE — Progress Notes (Signed)
Primary Care Physician: Benita Stabile, MD  Primary Gastroenterologist:  Roetta Sessions, MD   Chief Complaint  Patient presents with  . Nausea    flare up 2 wks ago  . Gas    HPI: Derek Marsh is a 58 y.o. male here for a follow-up.  He has GERD/short segment Barrett's esophagus.  He also has a history of episodic abdominal bloating associated with gas/flatulence/belching, nausea and occasional vomiting.  Work-up has included upper endoscopy.  This revealed Barrett's esophagus and small hiatal hernia.  Plans for EGD in 3 years.  He also had a negative gallbladder ultrasound in June 2019.  HIDA scan in December 2019 with normal ejection fraction of 73%.  Patient denies any symptoms during HIDA, after ingesting Ensure.  He is on Dexilant 60 mg daily.  He states he was doing well since December however over a week ago he developed recurrent symptoms.  Initially starts out with feeling bloated, significant malodorous gas both belching and flatulence associated with nausea.  Sometimes has bilious emesis.  Reflux is typically controlled.  Will have discomfort in the left upper quadrant.  After the symptoms start and you will develop diarrhea.  No melena or rectal bleeding.  Takes Pepto-Bismol and get some relief.  His last episode only lasted 2 to 3 days but sometimes can last up to a week.    The symptoms have been occurring for about 4 years.  Initially he may go 5 to 6 months in between episodes but over the past year they became more frequent.  Sometimes debilitating and he cannot go to work.  Weight has been stable. Patient also has a history of fatty liver on ultrasound.  Labs from October 2019: White blood cell count 5000, hemoglobin 16.5, platelets 196,000, glucose 92, creatinine 1.01, BUN 14, estimated GFR 82, albumin 4.6, total bilirubin 1.1, alkaline phosphatase 97, AST 27, ALT 27, albumin 4.6.  Iron 135, iron saturations 33%, TIBC 411, immunoglobulins IgG IgM and IgA all normal.   Anti-smooth muscle antibody negative, mitochondrial antibody negative, A1c 5.1%, hepatitis C antibody negative, ANA negative, ferritin 68, hepatitis B surface antigen negative.       Current Outpatient Medications  Medication Sig Dispense Refill  . aspirin 325 MG EC tablet Take 325 mg by mouth at bedtime.     [name] Kitchen b complex vitamins tablet Take 1 tablet by mouth daily.    . celecoxib (CELEBREX) 100 MG capsule Take 100 mg by mouth 2 (two) times daily.    . cetirizine (ZYRTEC) 10 MG tablet Take 10 mg by mouth at bedtime.     . Dapagliflozin-Metformin HCl ER 5-500 MG TB24 Take 5 mg by mouth 2 (two) times daily.     [name] Kitchen desvenlafaxine (PRISTIQ) 50 MG 24 hr tablet Take 50 mg by mouth every evening.     [name] Kitchen DEXILANT 60 MG capsule Take 60 mg by mouth at bedtime.   2  . diphenhydrAMINE (BENADRYL) 25 MG tablet Take 25 mg by mouth daily as needed for allergies.    [name] Kitchen GLUCOS-CHONDROIT-MSM-C-HYAL PO Take 2 tablets by mouth at bedtime.    . Liraglutide 18 MG/3ML SOPN Inject 1.8 mg into the skin at bedtime.     . mometasone (NASONEX) 50 MCG/ACT nasal spray Place 2 sprays into the nose at bedtime.     . Multiple Vitamin (MULITIVITAMIN WITH MINERALS) TABS Take 1 tablet by mouth at bedtime.     [name] Kitchen omega-3 acid ethyl esters (LOVAZA) 1  G capsule Take 3 g by mouth every evening.     [name] Kitchen OVER THE COUNTER MEDICATION Take 25 mg by mouth 2 (two) times daily. CBD    . Probiotic Product (PROBIOTIC DAILY PO) Take 1 capsule by mouth at bedtime.     . simvastatin (ZOCOR) 20 MG tablet Take 20 mg by mouth at bedtime.    . tadalafil (CIALIS) 20 MG tablet Take 20 mg by mouth daily as needed for erectile dysfunction.    . Testosterone 10 MG/ACT (2%) GEL Apply 4 Pump topically at bedtime.  0  . TURMERIC PO Take 1,400 mg by mouth at bedtime.     . valsartan-hydrochlorothiazide (DIOVAN-HCT) 80-12.5 MG per tablet Take 1 tablet by mouth at bedtime.      No current facility-administered medications for this visit.     Allergies as of  10/28/2018  . (No Known Allergies)    ROS:  General: Negative for anorexia, weight loss, fever, chills, fatigue, weakness. ENT: Negative for hoarseness, difficulty swallowing , nasal congestion. CV: Negative for chest pain, angina, palpitations, dyspnea on exertion, peripheral edema.  Respiratory: Negative for dyspnea at rest, dyspnea on exertion, cough, sputum, wheezing.  GI: See history of present illness. GU:  Negative for dysuria, hematuria, urinary incontinence, urinary frequency, nocturnal urination.  Endo: Negative for unusual weight change.    Physical Examination:   BP 127/80   Pulse 85   Temp (!) 97.3 F (36.3 C) (Oral)   Ht 5\' 10"  (1.778 m)   Wt 269 lb 9.6 oz (122.3 kg)   BMI 38.68 kg/m   General: Well-nourished, well-developed in no acute distress.  Eyes: No icterus. Mouth: Oropharyngeal mucosa moist and pink , no lesions erythema or exudate. Lungs: Clear to auscultation bilaterally.  Heart: Regular rate and rhythm, no murmurs rubs or gallops.  Abdomen: Bowel sounds are normal, nontender, nondistended, no hepatosplenomegaly or masses, no abdominal bruits or hernia , no rebound or guarding.   Extremities: No lower extremity edema. No clubbing or deformities. Neuro: Alert and oriented x 4   Skin: Warm and dry, no jaundice.   Psych: Alert and cooperative, normal mood and affect.  Labs:  See HPI Imaging Studies: No results found.

## 2018-10-29 LAB — TISSUE TRANSGLUTAMINASE, IGA: (tTG) Ab, IgA: 1 U/mL

## 2018-10-29 NOTE — Progress Notes (Signed)
CC'D TO PCP °

## 2018-11-28 ENCOUNTER — Telehealth: Payer: Self-pay

## 2018-11-28 NOTE — Telephone Encounter (Signed)
Pt called to ask if there was anything else he could try otc topicals for an external hemorrhoid that's been present for a few days. Pt is [place] 1% HC cream that has lidocaine in it. I asked pt if he tried otc Lidocaine topical and he hasn't. Pt isn't having a lot of pain with the hemrrhoid. Please advise.

## 2018-12-02 NOTE — Telephone Encounter (Signed)
Noted. Pt states that he does most of the recommendations by [facility]. The hemorrhoid is decreasing in size. Pt has a f/u with our office soon and will discuss further if needed.

## 2018-12-02 NOTE — Telephone Encounter (Signed)
Check with patient. Just got this message due to Holiday.  As far as OTC hemorrhoid topicals, there is hydrocortisone with and without lidocaine. He could try Witch hazel pads.   Eat high-fiber foods. Eat more fruits, vegetables and whole grains. Doing so softens the stool and increases its bulk, which will help you avoid the straining that can worsen symptoms from existing hemorrhoids. Add fiber to your diet slowly to avoid problems with gas.  Soak regularly in a warm bath or sitz bath. Soak your anal area in plain warm water for 10 to 15 minutes two to three times a day. A sitz bath fits over the toilet.  Prescription hemorrhoid creams are not significantly stronger than the 1% hydrocortizone is already on.

## 2019-04-19 ENCOUNTER — Other Ambulatory Visit: Payer: Self-pay | Admitting: Adult Health Nurse Practitioner

## 2019-05-05 ENCOUNTER — Encounter: Payer: Self-pay | Admitting: Internal Medicine

## 2019-11-06 ENCOUNTER — Ambulatory Visit: Payer: BLUE CROSS/BLUE SHIELD | Attending: Internal Medicine

## 2019-11-06 DIAGNOSIS — Z23 Encounter for immunization: Secondary | ICD-10-CM

## 2019-11-06 NOTE — Progress Notes (Signed)
   Covid-19 Vaccination Clinic  Name:  JOEVON HOLLIMAN    MRN: [id number] DOB: 1961/02/28  11/06/2019  Mr. Mruk was observed post Covid-19 immunization for 15 minutes without incident. He was provided with Vaccine Information Sheet and instruction to access the V-Safe system.   Mr. Staller was instructed to call 911 with any severe reactions post vaccine: [name] Kitchen Difficulty breathing  . Swelling of face and throat  . A fast heartbeat  . A bad rash all over body  . Dizziness and weakness   Immunizations Administered    Name Date Dose VIS Date Route   Pfizer COVID-19 Vaccine 11/06/2019  2:12 PM 0.3 mL 08/01/2019 Intramuscular   Manufacturer: [employer], [employer]   Lot: [id number]   NDC: [address]-3

## 2019-12-03 ENCOUNTER — Ambulatory Visit: Payer: BLUE CROSS/BLUE SHIELD | Attending: Internal Medicine

## 2019-12-03 DIAGNOSIS — Z23 Encounter for immunization: Secondary | ICD-10-CM

## 2019-12-03 NOTE — Progress Notes (Signed)
   Covid-19 Vaccination Clinic  Name:  Derek Marsh    MRN: [id number] DOB: 11-24-1960  12/03/2019  [name] was observed post Covid-19 immunization for 15 minutes without incident. He was provided with Vaccine Information Sheet and instruction to access the V-Safe system.   [name] was instructed to call 911 with any severe reactions post vaccine: [name] Kitchen Difficulty breathing  . Swelling of face and throat  . A fast heartbeat  . A bad rash all over body  . Dizziness and weakness   Immunizations Administered    Name Date Dose VIS Date Route   Pfizer COVID-19 Vaccine 12/03/2019  8:36 AM 0.3 mL 08/01/2019 Intramuscular   Manufacturer: [employer], [employer]   Lot: [id number]   NDC: [address]-6

## 2020-01-20 ENCOUNTER — Encounter: Payer: Self-pay | Admitting: Cardiology

## 2020-01-20 ENCOUNTER — Encounter: Payer: Self-pay | Admitting: *Deleted

## 2020-01-20 ENCOUNTER — Other Ambulatory Visit: Payer: Self-pay

## 2020-01-20 ENCOUNTER — Ambulatory Visit (INDEPENDENT_AMBULATORY_CARE_PROVIDER_SITE_OTHER): Admitting: Cardiology

## 2020-01-20 VITALS — BP 100/68 | HR 77 | Ht 70.0 in | Wt 261.2 lb

## 2020-01-20 DIAGNOSIS — R079 Chest pain, unspecified: Secondary | ICD-10-CM

## 2020-01-20 DIAGNOSIS — Z8249 Family history of ischemic heart disease and other diseases of the circulatory system: Secondary | ICD-10-CM | POA: Diagnosis not present

## 2020-01-20 DIAGNOSIS — E782 Mixed hyperlipidemia: Secondary | ICD-10-CM | POA: Diagnosis not present

## 2020-01-20 DIAGNOSIS — I10 Essential (primary) hypertension: Secondary | ICD-10-CM

## 2020-01-20 NOTE — Patient Instructions (Addendum)
Medication Instructions:   Your physician recommends that you continue on your current medications as directed. Please refer to the Current Medication list given to you today.  Labwork:  NONE  Testing/Procedures: Your physician has requested that you have en exercise stress myoview. For further information please visit [email or web address]. Please follow instruction sheet, as given.  Follow-Up:  Your physician recommends that you schedule a follow-up appointment in: pending.  Any Other Special Instructions Will Be Listed Below (If Applicable).  If you need a refill on your cardiac medications before your next appointment, please call your pharmacy. 

## 2020-01-20 NOTE — Progress Notes (Signed)
Cardiology Office Note  Date: 01/20/2020   ID: Derek Marsh, DOB Sep 25, 1960, MRN [id number]  PCP:  Benita Stabile, MD  Cardiologist:  Nona Dell, MD Electrophysiologist:  None   Chief Complaint  Patient presents with  . Cardiac evaluation    History of Present Illness: Derek Marsh is a 59 y.o. male referred for cardiology consultation by Dr. Margo Aye for follow-up cardiac evaluation.  He has a family history of premature CAD, also personal history of hypertension, type 2 diabetes mellitus, and hyperlipidemia.  He has recently been on a diet and has lost about 35 pounds over the last 4 months.  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  In terms of symptoms, he does describe an intermittent chest discomfort that tends to occur when he is stressed, sometimes when he is driving or thinking about things, not with exertion however.  No sense of palpitations or syncope.  I personally reviewed his ECG today which shows normal sinus rhythm.  I reviewed his medications which are outlined below.  I also reviewed his recent lab work from May, LDL is 40.  He underwent an exercise Myoview back in 2016 that was low risk as outlined below.  Past Medical History:  Diagnosis Date  . Barrett's esophagus   . Depression   . Essential hypertension   . GERD (gastroesophageal reflux disease)   . Hyperlipidemia   . Low testosterone   . Obesity   . OSA (obstructive sleep apnea)    uses CPAP at 13  . Type 2 diabetes mellitus (HCC)   . Ventral hernia     Past Surgical History:  Procedure Laterality Date  . BIOPSY  05/22/2018   Procedure: BIOPSY;  Surgeon: Corbin Ade, MD;  [facility];  Service: Endoscopy;;  distal,esophagus  . COLONOSCOPY  11/13/2011   Dr. Jena Gauss: diverticulosis, hemorrhoids. next TCS 10/2021  . ESOPHAGOGASTRODUODENOSCOPY     Dr. Loreta Ave: says he had GERD  . ESOPHAGOGASTRODUODENOSCOPY N/A 05/22/2018   Dr. Jena Gauss: Small hiatal hernia,  Barrett's esophagus without dysplasia.  Next EGD in 3 years.    Current Outpatient Medications  Medication Sig Dispense Refill  . aspirin 325 MG EC tablet Take 325 mg by mouth at bedtime.     [name] Kitchen b complex vitamins tablet Take 1 tablet by mouth daily.    . celecoxib (CELEBREX) 100 MG capsule Take 100 mg by mouth 2 (two) times daily.    . cetirizine (ZYRTEC) 10 MG tablet Take 10 mg by mouth at bedtime.     [name] Kitchen desvenlafaxine (PRISTIQ) 50 MG 24 hr tablet Take 50 mg by mouth every evening.     [name] Kitchen GLUCOS-CHONDROIT-MSM-C-HYAL PO Take 2 tablets by mouth at bedtime.    . mometasone (NASONEX) 50 MCG/ACT nasal spray Place 2 sprays into the nose at bedtime.     . Multiple Vitamin (MULITIVITAMIN WITH MINERALS) TABS Take 1 tablet by mouth at bedtime.     . Omega-3 Fatty Acids (FISH OIL) 500 MG CAPS Take 2 capsules by mouth every evening.    [name] Kitchen OVER THE COUNTER MEDICATION Take 25 mg by mouth 2 (two) times daily. CBD    . Probiotic Product (PROBIOTIC DAILY PO) Take 1 capsule by mouth at bedtime.     . RABEprazole (ACIPHEX) 20 MG tablet Take 1 tablet by mouth in the morning and at bedtime.    . RYBELSUS 7 MG TABS Take 1 tablet by mouth in the morning.    [name] Kitchen  simvastatin (ZOCOR) 20 MG tablet Take 20 mg by mouth at bedtime.    [name] Kitchen SYNJARDY 12.5-500 MG TABS Take 1 tablet by mouth in the morning and at bedtime.    . tadalafil (CIALIS) 20 MG tablet Take 20 mg by mouth daily as needed for erectile dysfunction.    . Testosterone 10 MG/ACT (2%) GEL Apply 4 Pump topically at bedtime.  0  . TURMERIC PO Take 1,400 mg by mouth at bedtime.     . valsartan-hydrochlorothiazide (DIOVAN-HCT) 80-12.5 MG per tablet Take 1 tablet by mouth at bedtime.      No current facility-administered medications for this visit.   Allergies:  Patient has no known allergies.   Social History: The patient  reports that he quit smoking about 13 years ago. His smoking use included cigarettes. He started smoking about 42 years ago. He has a 30.00  pack-year smoking history. He has never used smokeless tobacco. He reports current alcohol use. He reports that he does not use drugs.   Family History: The patient's family history includes Diabetes in an other family member; Other in his daughter.   ROS:   No palpitations or syncope.  Physical Exam: VS:  BP 100/68   Pulse 77   Ht 5\' 10"  (1.778 m)   Wt 261 lb 3.2 oz (118.5 kg)   SpO2 97%   BMI 37.48 kg/m , BMI Body mass index is 37.48 kg/m.  Wt Readings from Last 3 Encounters:  01/20/20 261 lb 3.2 oz (118.5 kg)  10/28/18 269 lb 9.6 oz (122.3 kg)  07/26/18 255 lb 6.4 oz (115.8 kg)    General: Patient appears comfortable at rest. HEENT: Conjunctiva and lids normal, wearing a mask. Neck: Supple, no elevated JVP or carotid bruits, no thyromegaly. Lungs: Clear to auscultation, nonlabored breathing at rest. Cardiac: Regular rate and rhythm, no S3 or significant systolic murmur, no pericardial rub. Abdomen: Soft, nontender, bowel sounds present. Extremities: No pitting edema, distal pulses 2+. Skin: Warm and dry. Musculoskeletal: No kyphosis. Neuropsychiatric: Alert and oriented x3, affect grossly appropriate.  ECG:  An ECG dated 02/03/2015 was personally reviewed today and demonstrated:  Sinus rhythm with decreased R wave progression, rule out old inferior infarct pattern.  Recent Labwork:  May 2021: Hemoglobin 15.6, platelets 188, BUN 18, creatinine 0.97, potassium 4.1, AST 22, ALT 23, cholesterol 121, triglycerides 383, HDL 25, LDL 40  Other Studies Reviewed Today:  Exercise Myoview 09/28/2014: IMPRESSION:  1. Small apical and inferolateral defects as outlined above, most likely soft tissue attenuation, although cannot definitively exclude a mild region of ischemia in the inferolateral wall. Exercise ECG did not show diagnostic ST segment changes, and the Duke treadmill score was low risk at 6.5.  2. Normal left ventricular wall motion.  3. Left ventricular ejection  fraction 64%  4. Low-risk stress test findings*.  Assessment and Plan:  1.  Recurrent chest pain, largely atypical features however with a family history of premature CAD and personal history of hypertension, hyperlipidemia, and type 2 diabetes mellitus.  ECG is normal today.  He is currently on aspirin and Zocor with LDL 40.  He is also working on weight loss through diet.  Plan to proceed with a 2-day protocol exercise Myoview.  Last ischemic evaluation was in 2016.  2.  Mixed hyperlipidemia, on Zocor with recent LDL 40.  Medication Adjustments/Labs and Tests Ordered: Current medicines are reviewed at length with the patient today.  Concerns regarding medicines are outlined above.   Tests Ordered: Orders  Placed This Encounter  Procedures  . NM Myocar Multi W/Spect W/Wall Motion / EF  . EKG 12-Lead    Medication Changes: No orders of the defined types were placed in this encounter.   Disposition:  Follow up test results.  Signed, Satira Sark, MD, [facility] 01/20/2020 3:27 PM    [place] [facility] [place], [place], [place] [address] Phone: [phone]; Fax: [phone]

## 2020-01-22 ENCOUNTER — Telehealth: Payer: Self-pay | Admitting: Cardiology

## 2020-01-22 NOTE — Telephone Encounter (Signed)
Pre-cert Verification for the following procedure :  Exercise Stress 2 DAY PROTOCOL  DATE:     02/04/2020  LOCATION:  [facility]

## 2020-02-03 ENCOUNTER — Other Ambulatory Visit (HOSPITAL_COMMUNITY)
Admission: RE | Admit: 2020-02-03 | Discharge: 2020-02-03 | Disposition: A | Discharge status: Home / Self Care | Source: Ambulatory Visit | Attending: Cardiology | Admitting: Cardiology

## 2020-02-03 ENCOUNTER — Other Ambulatory Visit: Payer: Self-pay

## 2020-02-03 DIAGNOSIS — Z01812 Encounter for preprocedural laboratory examination: Secondary | ICD-10-CM | POA: Diagnosis present

## 2020-02-03 DIAGNOSIS — Z20822 Contact with and (suspected) exposure to covid-19: Secondary | ICD-10-CM | POA: Diagnosis not present

## 2020-02-03 LAB — SARS CORONAVIRUS 2 (TAT 6-24 HRS): SARS Coronavirus 2: NEGATIVE

## 2020-02-04 ENCOUNTER — Encounter (HOSPITAL_COMMUNITY)
Admission: RE | Admit: 2020-02-04 | Discharge: 2020-02-04 | Disposition: A | Discharge status: Home / Self Care | Source: Ambulatory Visit | Attending: Cardiology | Admitting: Cardiology

## 2020-02-04 ENCOUNTER — Ambulatory Visit (HOSPITAL_COMMUNITY)
Admission: RE | Admit: 2020-02-04 | Discharge: 2020-02-04 | Disposition: A | Discharge status: Home / Self Care | Source: Ambulatory Visit | Attending: Cardiology | Admitting: Cardiology

## 2020-02-04 ENCOUNTER — Encounter (HOSPITAL_COMMUNITY): Payer: Self-pay

## 2020-02-04 DIAGNOSIS — R079 Chest pain, unspecified: Secondary | ICD-10-CM | POA: Diagnosis not present

## 2020-02-04 MED ORDER — REGADENOSON 0.4 MG/5ML IV SOLN
INTRAVENOUS | Status: AC
  Filled 2020-02-04: qty 5

## 2020-02-04 MED ORDER — TECHNETIUM TC 99M TETROFOSMIN IV KIT
30.0000 | PACK | Freq: Once | INTRAVENOUS | Status: AC | PRN
  Administered 2020-02-04: 31 via INTRAVENOUS

## 2020-02-04 MED ORDER — SODIUM CHLORIDE FLUSH 0.9 % IV SOLN
INTRAVENOUS | Status: AC
  Administered 2020-02-04: 10 mL via INTRAVENOUS
  Filled 2020-02-04: qty 10

## 2020-02-05 ENCOUNTER — Other Ambulatory Visit: Payer: Self-pay

## 2020-02-05 ENCOUNTER — Telehealth: Payer: Self-pay | Admitting: *Deleted

## 2020-02-05 ENCOUNTER — Encounter (HOSPITAL_COMMUNITY): Payer: Self-pay

## 2020-02-05 ENCOUNTER — Encounter (HOSPITAL_COMMUNITY)
Admission: RE | Admit: 2020-02-05 | Discharge: 2020-02-05 | Disposition: A | Discharge status: Home / Self Care | Source: Ambulatory Visit | Attending: Cardiology | Admitting: Cardiology

## 2020-02-05 LAB — NM MYOCAR MULTI W/SPECT W/WALL MOTION / EF
Estimated workload: 7 METS
Exercise duration (min): 6 min
Exercise duration (sec): 0 s
LV dias vol: 119 mL (ref 62–150)
LV sys vol: 47 mL
MPHR: 161 {beats}/min
Peak HR: 141 {beats}/min
Percent HR: 87 %
RATE: 0.37
RPE: 13
Rest HR: 69 {beats}/min
SDS: 1
SRS: 0
SSS: 1
TID: 0.99

## 2020-02-05 MED ORDER — TECHNETIUM TC 99M TETROFOSMIN IV KIT
30.0000 | PACK | Freq: Once | INTRAVENOUS | Status: AC | PRN
  Administered 2020-02-05: 32 via INTRAVENOUS

## 2020-02-05 NOTE — Telephone Encounter (Signed)
Patient informed. Copy sent to PCP °

## 2020-02-05 NOTE — Telephone Encounter (Signed)
-----   Message from Ellsworth Lennox, [place] sent at 02/05/2020  4:09 PM EDT ----- Covering for Dr. Diona Browner - Please let the patient know the EKG portion of his stress test was normal but imaging showed areas of soft tissue artifact but ischemia (potential blockage) could not be ruled out. It was still read overall as a low risk study but given his episodes of chest discomfort, would recommend arranging follow-up next week with myself, Mardelle Matte or Dr. Diona Browner to discuss the next steps in regards to possible cardiac catheterization or continued medical management. I will leave in Dr. Ival Bible inbox for review upon his return on Monday so he can review the images as well and provide input on medical therapy versus catheterization.

## 2020-02-12 ENCOUNTER — Ambulatory Visit: Admitting: Cardiology

## 2020-02-19 ENCOUNTER — Telehealth: Payer: Self-pay | Admitting: Cardiology

## 2020-02-19 ENCOUNTER — Ambulatory Visit (INDEPENDENT_AMBULATORY_CARE_PROVIDER_SITE_OTHER): Admitting: Cardiology

## 2020-02-19 ENCOUNTER — Other Ambulatory Visit: Payer: Self-pay | Admitting: Cardiology

## 2020-02-19 ENCOUNTER — Other Ambulatory Visit: Payer: Self-pay

## 2020-02-19 ENCOUNTER — Other Ambulatory Visit (HOSPITAL_COMMUNITY)
Admission: RE | Admit: 2020-02-19 | Discharge: 2020-02-19 | Disposition: A | Discharge status: Home / Self Care | Source: Ambulatory Visit | Attending: Cardiology | Admitting: Cardiology

## 2020-02-19 ENCOUNTER — Encounter: Payer: Self-pay | Admitting: Cardiology

## 2020-02-19 VITALS — BP 114/76 | HR 78 | Ht 70.0 in | Wt 263.0 lb

## 2020-02-19 DIAGNOSIS — R079 Chest pain, unspecified: Secondary | ICD-10-CM

## 2020-02-19 DIAGNOSIS — E782 Mixed hyperlipidemia: Secondary | ICD-10-CM

## 2020-02-19 DIAGNOSIS — R9439 Abnormal result of other cardiovascular function study: Secondary | ICD-10-CM

## 2020-02-19 DIAGNOSIS — Z01810 Encounter for preprocedural cardiovascular examination: Secondary | ICD-10-CM

## 2020-02-19 LAB — CBC
HCT: 46.5 % (ref 39.0–52.0)
Hemoglobin: 15.3 g/dL (ref 13.0–17.0)
MCH: 29.3 pg (ref 26.0–34.0)
MCHC: 32.9 g/dL (ref 30.0–36.0)
MCV: 89.1 fL (ref 80.0–100.0)
Platelets: 170 10*3/uL (ref 150–400)
RBC: 5.22 MIL/uL (ref 4.22–5.81)
RDW: 14 % (ref 11.5–15.5)
WBC: 6.7 10*3/uL (ref 4.0–10.5)
nRBC: 0 % (ref 0.0–0.2)

## 2020-02-19 LAB — BASIC METABOLIC PANEL
Anion gap: 11 (ref 5–15)
BUN: 21 mg/dL — ABNORMAL HIGH (ref 6–20)
CO2: 24 mmol/L (ref 22–32)
Calcium: 9.5 mg/dL (ref 8.9–10.3)
Chloride: 100 mmol/L (ref 98–111)
Creatinine, Ser: 0.83 mg/dL (ref 0.61–1.24)
GFR calc Af Amer: >60 mL/min (ref >60)
GFR calc non Af Amer: >60 mL/min (ref >60)
Glucose, Bld: 104 mg/dL — ABNORMAL HIGH (ref 70–99)
Potassium: 4.1 mmol/L (ref 3.5–5.1)
Sodium: 135 mmol/L (ref 135–145)

## 2020-02-19 MED ORDER — SODIUM CHLORIDE 0.9% FLUSH: 3.0000 mL | Freq: Two times a day (BID) | INTRAVENOUS | Status: AC

## 2020-02-19 MED ORDER — ASPIRIN EC 81 MG PO TBEC
81.0000 mg | DELAYED_RELEASE_TABLET | Freq: Every day | ORAL | 3 refills | Status: AC
Start: 2020-02-19 — End: ?

## 2020-02-19 NOTE — Telephone Encounter (Signed)
Pre-cert Verification for the following procedure    Left heart cath dx: abnormal myoview & recurrent chest pain scheduled 02/20/2020 @7 :30 am with Dr. 

## 2020-02-19 NOTE — Patient Instructions (Addendum)
Medication Instructions:   Your physician has recommended you make the following change in your medication:   Decrease aspirin to 81 mg by mouth daily  Continue other medications the same  Labwork:  Your physician recommends that you return for lab work in: TODAY to check your BMET & CBC. Please have this done at [facility].  Testing/Procedures: Your physician has requested that you have a cardiac catheterization. Cardiac catheterization is used to diagnose and/or treat various heart conditions. Doctors may recommend this procedure for a number of different reasons. The most common reason is to evaluate chest pain. Chest pain can be a symptom of coronary artery disease (CAD), and cardiac catheterization can show whether plaque is narrowing or blocking your heart's arteries. This procedure is also used to evaluate the valves, as well as measure the blood flow and oxygen levels in different parts of your heart. For further information please visit [email or web address] Please follow instruction sheet, as given.  Follow-Up:  Your physician recommends that you schedule a follow-up appointment in: 1 month (office).  Any Other Special Instructions Will Be Listed Below (If Applicable).  If you need a refill on your cardiac medications before your next appointment, please call your pharmacy.     [facility] [address] [place] [address] Dept: [phone] Loc: [phone]  Nishawn Rotan [place]  02/19/2020  You are scheduled for a Cardiac Catheterization on Friday, July 2 with Dr. Cristal Deer End.  1. Please arrive at the [facility] (Main Entrance A) at [facility]: [address] [place], [place] [address] at 5:30 AM (This time is two hours before your procedure to ensure your preparation). Free valet parking service is available.   Special note: Every effort is made to have  your procedure done on time. Please understand that emergencies sometimes delay scheduled procedures.  2. Diet: Do not eat solid foods after midnight.  The patient may have clear liquids until 5am upon the day of the procedure.  3. Labs: You will need to have blood drawn on Thursday, July 1 at [facility]. You do not need to be fasting.  4. Medication instructions in preparation for your procedure:   Contrast Allergy: No  Do Not take Synjardy or Rybelsus on the morning of your cath. HOLD Synjardy 48 hour after your cath.  Diovan/hct taken at bedtime  On the morning of your procedure, take your Aspirin 81  and any morning medicines NOT listed above.  You may use sips of water.  5. Plan for one night stay--bring personal belongings. 6. Bring a current list of your medications and current insurance cards. 7. You MUST have a responsible person to drive you home. 8. Someone MUST be with you the first 24 hours after you arrive home or your discharge will be delayed. 9. Please wear clothes that are easy to get on and off and wear slip-on shoes.  Thank you for allowing [place] to care for you!   -- [facility]

## 2020-02-19 NOTE — H&P (View-Only) (Signed)
Cardiology Office Note  Date: 02/19/2020   ID: [name], DOB Mar 04, 1961, MRN [id number]  PCP:  Benita Stabile, MD  Cardiologist:  Nona Dell, MD Electrophysiologist:  None   Chief Complaint  Patient presents with  . Follow-up cardiac testing    History of Present Illness: Derek Marsh is a 59 y.o. male seen in consultation in June and referred at that time for stress testing with recurrent but somewhat atypical chest pain and intermediate pretest probability of ischemic heart disease.  Exercise Myoview performed on June 16 was overall low risk, no diagnostic ST segment changes, however with perfusion defects raising the possibility of inferior/lateral scar with a mid to basal inferior peri-infarct ischemia.  We discussed the results of his testing today.  He continues to report intermittent left-sided chest discomfort, mainly with emotional stress.  We discussed the risks and benefits of a diagnostic cardiac catheterization to further clarify the situation and evaluate coronary anatomy for any revascularization options.  He is in agreement to proceed.  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  Past Medical History:  Diagnosis Date  . Barrett's esophagus   . Depression   . Essential hypertension   . GERD (gastroesophageal reflux disease)   . Hyperlipidemia   . Low testosterone   . Obesity   . OSA (obstructive sleep apnea)    uses CPAP at 13  . Type 2 diabetes mellitus (HCC)   . Ventral hernia     Past Surgical History:  Procedure Laterality Date  . BIOPSY  05/22/2018   Procedure: BIOPSY;  Surgeon: Corbin Ade, MD;  [facility];  Service: Endoscopy;;  distal,esophagus  . COLONOSCOPY  11/13/2011   Dr. Jena Gauss: diverticulosis, hemorrhoids. next TCS 10/2021  . ESOPHAGOGASTRODUODENOSCOPY     Dr. Loreta Ave: says he had GERD  . ESOPHAGOGASTRODUODENOSCOPY N/A 05/22/2018   Dr. Jena Gauss: Small hiatal hernia, Barrett's esophagus without  dysplasia.  Next EGD in 3 years.    Current Outpatient Medications  Medication Sig Dispense Refill  . b complex vitamins tablet Take 1 tablet by mouth daily.    . celecoxib (CELEBREX) 100 MG capsule Take 100 mg by mouth 2 (two) times daily.    . cetirizine (ZYRTEC) 10 MG tablet Take 10 mg by mouth at bedtime.     [name] Kitchen desvenlafaxine (PRISTIQ) 50 MG 24 hr tablet Take 50 mg by mouth every evening.     [name] Kitchen GLUCOS-CHONDROIT-MSM-C-HYAL PO Take 2 tablets by mouth at bedtime.    . mometasone (NASONEX) 50 MCG/ACT nasal spray Place 2 sprays into the nose at bedtime.     . Multiple Vitamin (MULITIVITAMIN WITH MINERALS) TABS Take 1 tablet by mouth at bedtime.     . Omega-3 Fatty Acids (FISH OIL) 500 MG CAPS Take 2 capsules by mouth every evening.    [name] Kitchen OVER THE COUNTER MEDICATION Take 25 mg by mouth 2 (two) times daily. CBD    . Probiotic Product (PROBIOTIC DAILY PO) Take 1 capsule by mouth at bedtime.     . RABEprazole (ACIPHEX) 20 MG tablet Take 1 tablet by mouth in the morning and at bedtime.    . RYBELSUS 7 MG TABS Take 1 tablet by mouth in the morning.    . simvastatin (ZOCOR) 20 MG tablet Take 20 mg by mouth at bedtime.    [name] Kitchen SYNJARDY 12.5-500 MG TABS Take 1 tablet by mouth in the morning and at bedtime.    . tadalafil (CIALIS) 20  MG tablet Take 20 mg by mouth daily as needed for erectile dysfunction.    . Testosterone 10 MG/ACT (2%) GEL Apply 4 Pump topically at bedtime.  0  . TURMERIC PO Take 1,400 mg by mouth at bedtime.     . valsartan-hydrochlorothiazide (DIOVAN-HCT) 80-12.5 MG per tablet Take 1 tablet by mouth at bedtime.     [name] Kitchen aspirin EC 81 MG tablet Take 1 tablet (81 mg total) by mouth daily. Swallow whole. 90 tablet 3   No current facility-administered medications for this visit.   Allergies:  Patient has no known allergies.   Social History: The patient  reports that he quit smoking about 13 years ago. His smoking use included cigarettes. He started smoking about 42 years ago. He has a  30.00 pack-year smoking history. He has never used smokeless tobacco. He reports current alcohol use. He reports that he does not use drugs.   Family History: The patient's family history includes Diabetes in an other family member; Other in his daughter.   ROS:   No palpitations or syncope.  Physical Exam: VS:  BP 114/76   Pulse 78   Ht 5\' 10"  (1.778 m)   Wt 263 lb (119.3 kg)   SpO2 97%   BMI 37.74 kg/m , BMI Body mass index is 37.74 kg/m.  Wt Readings from Last 3 Encounters:  02/19/20 263 lb (119.3 kg)  01/20/20 261 lb 3.2 oz (118.5 kg)  10/28/18 269 lb 9.6 oz (122.3 kg)    General: Patient appears comfortable at rest. HEENT: Conjunctiva and lids normal, wearing a mask. Neck: Supple, no elevated JVP or carotid bruits, no thyromegaly. Lungs: Clear to auscultation, nonlabored breathing at rest. Cardiac: Regular rate and rhythm, no S3 or significant systolic murmur, no pericardial rub. Abdomen: Soft, nontender, bowel sounds present. Extremities: No pitting edema, distal pulses 2+. Skin: Warm and dry. Musculoskeletal: No kyphosis. Neuropsychiatric: Alert and oriented x3, affect grossly appropriate.  ECG:  An ECG dated 01/20/2020 was personally reviewed today and demonstrated:  Normal sinus rhythm.  Recent Labwork:  May 2021: Hemoglobin 15.6, platelets 188, BUN 18, creatinine 0.97, potassium 4.1, AST 22, ALT 23, cholesterol 121, triglycerides 383, HDL 25, LDL 40  Other Studies Reviewed Today:  Exercise Myoview 02/04/2020:  Blood pressure demonstrated a normal response to exercise.  This is a low risk study.  The left ventricular ejection fraction is normal (55-65%).  There was no ST segment deviation noted during stress.   Attenuation of the inferior wall from apex to base with normal wall motion Cannot r/o IMI with peri infarct ischemia in inferior lateral wall at base Extra cardiac uptake and diaphragmatic attenuation limit specificity of Findings EF normal 60%  Suggest further evaluation ETT portion of study was negative.  Assessment and Plan:  1.  Recurrent chest discomfort with mixed typical and atypical features, primarily with increased mental stress, also intermediate pretest probability of ischemic heart disease with known history of hypertension, hyperlipidemia, and type 2 diabetes mellitus.  Screening exercise Myoview although being overall low risk, did have perfusion defects that do not exclude the possibility of inferior/lateral scar with peri-infarct ischemia.  We discussed the risks and benefits of a diagnostic cardiac catheterization and he is in agreement to proceed.  Continue aspirin and Zocor.  2.,  He continues on Zocor with recent LDL 40.  Medication Adjustments/Labs and Tests Ordered: Current medicines are reviewed at length with the patient today.  Concerns regarding medicines are outlined above.   Tests Ordered: Orders  Placed This Encounter  Procedures  . Basic metabolic panel  . CBC  . EKG 12-Lead    Medication Changes: Meds ordered this encounter  Medications  . aspirin EC 81 MG tablet    Sig: Take 1 tablet (81 mg total) by mouth daily. Swallow whole.    Dispense:  90 tablet    Refill:  3    02/19/2020 dose decrease    Disposition:  Follow up after testing.  Signed, Jonelle Sidle, MD, [facility] 02/19/2020 8:56 AM    [facility] at [facility] [address] [place], [place], [place] [address] Phone: [phone]; Fax: [phone]

## 2020-02-19 NOTE — Progress Notes (Signed)
Cardiology Office Note  Date: 02/19/2020   ID: [name], DOB Mar 04, 1961, MRN [id number]  PCP:  Benita Stabile, MD  Cardiologist:  Nona Dell, MD Electrophysiologist:  None   Chief Complaint  Patient presents with  . Follow-up cardiac testing    History of Present Illness: DILLYN MENNA is a 59 y.o. male seen in consultation in June and referred at that time for stress testing with recurrent but somewhat atypical chest pain and intermediate pretest probability of ischemic heart disease.  Exercise Myoview performed on June 16 was overall low risk, no diagnostic ST segment changes, however with perfusion defects raising the possibility of inferior/lateral scar with a mid to basal inferior peri-infarct ischemia.  We discussed the results of his testing today.  He continues to report intermittent left-sided chest discomfort, mainly with emotional stress.  We discussed the risks and benefits of a diagnostic cardiac catheterization to further clarify the situation and evaluate coronary anatomy for any revascularization options.  He is in agreement to proceed.  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  Past Medical History:  Diagnosis Date  . Barrett's esophagus   . Depression   . Essential hypertension   . GERD (gastroesophageal reflux disease)   . Hyperlipidemia   . Low testosterone   . Obesity   . OSA (obstructive sleep apnea)    uses CPAP at 13  . Type 2 diabetes mellitus (HCC)   . Ventral hernia     Past Surgical History:  Procedure Laterality Date  . BIOPSY  05/22/2018   Procedure: BIOPSY;  Surgeon: Corbin Ade, MD;  [facility];  Service: Endoscopy;;  distal,esophagus  . COLONOSCOPY  11/13/2011   Dr. Jena Gauss: diverticulosis, hemorrhoids. next TCS 10/2021  . ESOPHAGOGASTRODUODENOSCOPY     Dr. Loreta Ave: says he had GERD  . ESOPHAGOGASTRODUODENOSCOPY N/A 05/22/2018   Dr. Jena Gauss: Small hiatal hernia, Barrett's esophagus without  dysplasia.  Next EGD in 3 years.    Current Outpatient Medications  Medication Sig Dispense Refill  . b complex vitamins tablet Take 1 tablet by mouth daily.    . celecoxib (CELEBREX) 100 MG capsule Take 100 mg by mouth 2 (two) times daily.    . cetirizine (ZYRTEC) 10 MG tablet Take 10 mg by mouth at bedtime.     [name] Kitchen desvenlafaxine (PRISTIQ) 50 MG 24 hr tablet Take 50 mg by mouth every evening.     [name] Kitchen GLUCOS-CHONDROIT-MSM-C-HYAL PO Take 2 tablets by mouth at bedtime.    . mometasone (NASONEX) 50 MCG/ACT nasal spray Place 2 sprays into the nose at bedtime.     . Multiple Vitamin (MULITIVITAMIN WITH MINERALS) TABS Take 1 tablet by mouth at bedtime.     . Omega-3 Fatty Acids (FISH OIL) 500 MG CAPS Take 2 capsules by mouth every evening.    [name] Kitchen OVER THE COUNTER MEDICATION Take 25 mg by mouth 2 (two) times daily. CBD    . Probiotic Product (PROBIOTIC DAILY PO) Take 1 capsule by mouth at bedtime.     . RABEprazole (ACIPHEX) 20 MG tablet Take 1 tablet by mouth in the morning and at bedtime.    . RYBELSUS 7 MG TABS Take 1 tablet by mouth in the morning.    . simvastatin (ZOCOR) 20 MG tablet Take 20 mg by mouth at bedtime.    [name] Kitchen SYNJARDY 12.5-500 MG TABS Take 1 tablet by mouth in the morning and at bedtime.    . tadalafil (CIALIS) 20  MG tablet Take 20 mg by mouth daily as needed for erectile dysfunction.    . Testosterone 10 MG/ACT (2%) GEL Apply 4 Pump topically at bedtime.  0  . TURMERIC PO Take 1,400 mg by mouth at bedtime.     . valsartan-hydrochlorothiazide (DIOVAN-HCT) 80-12.5 MG per tablet Take 1 tablet by mouth at bedtime.     . aspirin EC 81 MG tablet Take 1 tablet (81 mg total) by mouth daily. Swallow whole. 90 tablet 3   No current facility-administered medications for this visit.   Allergies:  Patient has no known allergies.   Social History: The patient  reports that he quit smoking about 13 years ago. His smoking use included cigarettes. He started smoking about 42 years ago. He has a  30.00 pack-year smoking history. He has never used smokeless tobacco. He reports current alcohol use. He reports that he does not use drugs.   Family History: The patient's family history includes Diabetes in an other family member; Other in his daughter.   ROS:   No palpitations or syncope.  Physical Exam: VS:  BP 114/76   Pulse 78   Ht 5' 10" (1.778 m)   Wt 263 lb (119.3 kg)   SpO2 97%   BMI 37.74 kg/m , BMI Body mass index is 37.74 kg/m.  Wt Readings from Last 3 Encounters:  02/19/20 263 lb (119.3 kg)  01/20/20 261 lb 3.2 oz (118.5 kg)  10/28/18 269 lb 9.6 oz (122.3 kg)    General: Patient appears comfortable at rest. HEENT: Conjunctiva and lids normal, wearing a mask. Neck: Supple, no elevated JVP or carotid bruits, no thyromegaly. Lungs: Clear to auscultation, nonlabored breathing at rest. Cardiac: Regular rate and rhythm, no S3 or significant systolic murmur, no pericardial rub. Abdomen: Soft, nontender, bowel sounds present. Extremities: No pitting edema, distal pulses 2+. Skin: Warm and dry. Musculoskeletal: No kyphosis. Neuropsychiatric: Alert and oriented x3, affect grossly appropriate.  ECG:  An ECG dated 01/20/2020 was personally reviewed today and demonstrated:  Normal sinus rhythm.  Recent Labwork:  May 2021: Hemoglobin 15.6, platelets 188, BUN 18, creatinine 0.97, potassium 4.1, AST 22, ALT 23, cholesterol 121, triglycerides 383, HDL 25, LDL 40  Other Studies Reviewed Today:  Exercise Myoview 02/04/2020:  Blood pressure demonstrated a normal response to exercise.  This is a low risk study.  The left ventricular ejection fraction is normal (55-65%).  There was no ST segment deviation noted during stress.   Attenuation of the inferior wall from apex to base with normal wall motion Cannot r/o IMI with peri infarct ischemia in inferior lateral wall at base Extra cardiac uptake and diaphragmatic attenuation limit specificity of Findings EF normal 60%  Suggest further evaluation ETT portion of study was negative.  Assessment and Plan:  1.  Recurrent chest discomfort with mixed typical and atypical features, primarily with increased mental stress, also intermediate pretest probability of ischemic heart disease with known history of hypertension, hyperlipidemia, and type 2 diabetes mellitus.  Screening exercise Myoview although being overall low risk, did have perfusion defects that do not exclude the possibility of inferior/lateral scar with peri-infarct ischemia.  We discussed the risks and benefits of a diagnostic cardiac catheterization and he is in agreement to proceed.  Continue aspirin and Zocor.  2.,  He continues on Zocor with recent LDL 40.  Medication Adjustments/Labs and Tests Ordered: Current medicines are reviewed at length with the patient today.  Concerns regarding medicines are outlined above.   Tests Ordered: Orders   Placed This Encounter  Procedures  . Basic metabolic panel  . CBC  . EKG 12-Lead    Medication Changes: Meds ordered this encounter  Medications  . aspirin EC 81 MG tablet    Sig: Take 1 tablet (81 mg total) by mouth daily. Swallow whole.    Dispense:  90 tablet    Refill:  3    02/19/2020 dose decrease    Disposition:  Follow up after testing.  Signed, Jonelle Sidle, MD, [facility] 02/19/2020 8:56 AM    [facility] at [facility] [address] [place], [place], [place] [address] Phone: [phone]; Fax: [phone]

## 2020-02-19 NOTE — Progress Notes (Signed)
Pt contacted pre-catheterization scheduled at [facility] for: Heart Cath  Verified arrival time and place: [facility] Main Entrance [facility]) at: Pt to arrive 5:30   No solid food after midnight prior to cath, clear liquids until 5 AM day of procedure.   AM meds can be  taken pre-cath with sips of water including: ASA 81 mg Synjardy hold Valsartan-HCT- hold   Confirmed patient has responsible adult to drive home post procedure and observe 24 hours after arriving home:   You are allowed ONE visitor in the waiting room during your procedure. Both you and your visitor must wear masks.      COVID-19 Pre-Screening Questions:  . In the past 7 to 10 days have you had a new cough, shortness of breath, headache, congestion, fever (100 or greater) unexplained body aches, new sore throat, or sudden loss of taste or sense of smell?  No Current symptoms  . In the past 7 to 10 days have you been around anyone with known Covid 19?   NO exposure

## 2020-02-20 ENCOUNTER — Encounter (HOSPITAL_COMMUNITY): Payer: Self-pay | Admitting: Internal Medicine

## 2020-02-20 ENCOUNTER — Other Ambulatory Visit: Payer: Self-pay

## 2020-02-20 ENCOUNTER — Ambulatory Visit (HOSPITAL_COMMUNITY)
Admission: RE | Admit: 2020-02-20 | Discharge: 2020-02-20 | Disposition: A | Discharge status: Home / Self Care | Attending: Internal Medicine | Admitting: Internal Medicine

## 2020-02-20 ENCOUNTER — Encounter (HOSPITAL_COMMUNITY)
Admission: RE | Disposition: A | Discharge status: Home / Self Care | Payer: Self-pay | Source: Home / Self Care | Attending: Internal Medicine

## 2020-02-20 DIAGNOSIS — Z6837 Body mass index (BMI) 37.0-37.9, adult: Secondary | ICD-10-CM | POA: Diagnosis not present

## 2020-02-20 DIAGNOSIS — E119 Type 2 diabetes mellitus without complications: Secondary | ICD-10-CM | POA: Insufficient documentation

## 2020-02-20 DIAGNOSIS — Z7982 Long term (current) use of aspirin: Secondary | ICD-10-CM | POA: Insufficient documentation

## 2020-02-20 DIAGNOSIS — I10 Essential (primary) hypertension: Secondary | ICD-10-CM | POA: Diagnosis not present

## 2020-02-20 DIAGNOSIS — R079 Chest pain, unspecified: Secondary | ICD-10-CM

## 2020-02-20 DIAGNOSIS — Z791 Long term (current) use of non-steroidal anti-inflammatories (NSAID): Secondary | ICD-10-CM | POA: Insufficient documentation

## 2020-02-20 DIAGNOSIS — R072 Precordial pain: Secondary | ICD-10-CM | POA: Diagnosis present

## 2020-02-20 DIAGNOSIS — G4733 Obstructive sleep apnea (adult) (pediatric): Secondary | ICD-10-CM | POA: Diagnosis not present

## 2020-02-20 DIAGNOSIS — E785 Hyperlipidemia, unspecified: Secondary | ICD-10-CM | POA: Insufficient documentation

## 2020-02-20 DIAGNOSIS — Z79899 Other long term (current) drug therapy: Secondary | ICD-10-CM | POA: Insufficient documentation

## 2020-02-20 DIAGNOSIS — E669 Obesity, unspecified: Secondary | ICD-10-CM | POA: Diagnosis not present

## 2020-02-20 DIAGNOSIS — Z87891 Personal history of nicotine dependence: Secondary | ICD-10-CM | POA: Insufficient documentation

## 2020-02-20 DIAGNOSIS — Z833 Family history of diabetes mellitus: Secondary | ICD-10-CM | POA: Diagnosis not present

## 2020-02-20 DIAGNOSIS — R9439 Abnormal result of other cardiovascular function study: Secondary | ICD-10-CM | POA: Diagnosis not present

## 2020-02-20 DIAGNOSIS — K219 Gastro-esophageal reflux disease without esophagitis: Secondary | ICD-10-CM | POA: Diagnosis not present

## 2020-02-20 HISTORY — PX: LEFT HEART CATH AND CORONARY ANGIOGRAPHY: CATH118249

## 2020-02-20 LAB — GLUCOSE, CAPILLARY: Glucose-Capillary: 115 mg/dL — ABNORMAL HIGH (ref 70–99)

## 2020-02-20 SURGERY — LEFT HEART CATH AND CORONARY ANGIOGRAPHY
Anesthesia: LOCAL

## 2020-02-20 MED ORDER — HEPARIN (PORCINE) IN NACL 1000-0.9 UT/500ML-% IV SOLN
INTRAVENOUS | Status: AC
  Filled 2020-02-20: qty 1000

## 2020-02-20 MED ORDER — IOHEXOL 350 MG/ML SOLN
INTRAVENOUS | Status: DC | PRN
  Administered 2020-02-20: 75 mL

## 2020-02-20 MED ORDER — ONDANSETRON HCL 4 MG/2ML IJ SOLN: 4.0000 mg | Freq: Four times a day (QID) | INTRAMUSCULAR | Status: DC | PRN

## 2020-02-20 MED ORDER — ACETAMINOPHEN 325 MG PO TABS: 650.0000 mg | ORAL_TABLET | ORAL | Status: DC | PRN

## 2020-02-20 MED ORDER — FENTANYL CITRATE (PF) 100 MCG/2ML IJ SOLN
INTRAMUSCULAR | Status: DC | PRN
  Administered 2020-02-20: 25 ug via INTRAVENOUS

## 2020-02-20 MED ORDER — SODIUM CHLORIDE 0.9% FLUSH: 3.0000 mL | INTRAVENOUS | Status: DC | PRN

## 2020-02-20 MED ORDER — VERAPAMIL HCL 2.5 MG/ML IV SOLN
INTRAVENOUS | Status: DC | PRN
  Administered 2020-02-20: 10 mL via INTRA_ARTERIAL

## 2020-02-20 MED ORDER — VERAPAMIL HCL 2.5 MG/ML IV SOLN
INTRAVENOUS | Status: AC
  Filled 2020-02-20: qty 2

## 2020-02-20 MED ORDER — LIDOCAINE HCL (PF) 1 % IJ SOLN
INTRAMUSCULAR | Status: AC
  Filled 2020-02-20: qty 30

## 2020-02-20 MED ORDER — HEPARIN (PORCINE) IN NACL 1000-0.9 UT/500ML-% IV SOLN
INTRAVENOUS | Status: DC | PRN
  Administered 2020-02-20 (×2): 500 mL

## 2020-02-20 MED ORDER — HEPARIN SODIUM (PORCINE) 1000 UNIT/ML IJ SOLN
INTRAMUSCULAR | Status: DC | PRN
  Administered 2020-02-20: 5000 [IU] via INTRAVENOUS

## 2020-02-20 MED ORDER — FENTANYL CITRATE (PF) 100 MCG/2ML IJ SOLN
INTRAMUSCULAR | Status: AC
  Filled 2020-02-20: qty 2

## 2020-02-20 MED ORDER — LABETALOL HCL 5 MG/ML IV SOLN: 10.0000 mg | INTRAVENOUS | Status: DC | PRN

## 2020-02-20 MED ORDER — HYDRALAZINE HCL 20 MG/ML IJ SOLN: 10.0000 mg | INTRAMUSCULAR | Status: DC | PRN

## 2020-02-20 MED ORDER — SODIUM CHLORIDE 0.9% FLUSH: 3.0000 mL | Freq: Two times a day (BID) | INTRAVENOUS | Status: DC

## 2020-02-20 MED ORDER — SODIUM CHLORIDE 0.9 % IV SOLN: INTRAVENOUS | Status: DC

## 2020-02-20 MED ORDER — MIDAZOLAM HCL 2 MG/2ML IJ SOLN
INTRAMUSCULAR | Status: AC
  Filled 2020-02-20: qty 2

## 2020-02-20 MED ORDER — SODIUM CHLORIDE 0.9 % WEIGHT BASED INFUSION: 1.0000 mL/kg/h | INTRAVENOUS | Status: DC

## 2020-02-20 MED ORDER — ASPIRIN 81 MG PO CHEW: 81.0000 mg | CHEWABLE_TABLET | ORAL | Status: DC

## 2020-02-20 MED ORDER — SYNJARDY 12.5-500 MG PO TABS: 1.0000 | ORAL_TABLET | Freq: Two times a day (BID) | ORAL | Status: AC

## 2020-02-20 MED ORDER — HEPARIN SODIUM (PORCINE) 1000 UNIT/ML IJ SOLN
INTRAMUSCULAR | Status: AC
  Filled 2020-02-20: qty 1

## 2020-02-20 MED ORDER — MIDAZOLAM HCL 2 MG/2ML IJ SOLN
INTRAMUSCULAR | Status: DC | PRN
  Administered 2020-02-20: 1 mg via INTRAVENOUS

## 2020-02-20 MED ORDER — SODIUM CHLORIDE 0.9 % WEIGHT BASED INFUSION
3.0000 mL/kg/h | INTRAVENOUS | Status: AC
  Administered 2020-02-20: 3 mL/kg/h via INTRAVENOUS

## 2020-02-20 MED ORDER — LIDOCAINE HCL (PF) 1 % IJ SOLN
INTRAMUSCULAR | Status: DC | PRN
  Administered 2020-02-20: 2 mL

## 2020-02-20 MED ORDER — SODIUM CHLORIDE 0.9 % IV SOLN: 250.0000 mL | INTRAVENOUS | Status: DC | PRN

## 2020-02-20 SURGICAL SUPPLY — 10 items
CATH 5FR JL3.5 JR4 ANG PIG MP (CATHETERS) ×2 IMPLANT
DEVICE RAD COMP TR BAND LRG (VASCULAR PRODUCTS) ×2 IMPLANT
GLIDESHEATH SLEND SS 6F .021 (SHEATH) ×2 IMPLANT
GUIDEWIRE INQWIRE 1.5J.035X260 (WIRE) ×1 IMPLANT
INQWIRE 1.5J .035X260CM (WIRE) ×2
KIT HEART LEFT (KITS) ×2 IMPLANT
PACK CARDIAC CATHETERIZATION (CUSTOM PROCEDURE TRAY) ×2 IMPLANT
SYR MEDRAD MARK 7 150ML (SYRINGE) ×2 IMPLANT
TRANSDUCER W/STOPCOCK (MISCELLANEOUS) ×2 IMPLANT
TUBING CIL FLEX 10 FLL-RA (TUBING) ×2 IMPLANT

## 2020-02-20 NOTE — Discharge Instructions (Signed)
DRINK PLENTY OF FLUIDS FOR THE NEXT 2-3 DAYS.  KEEP ARM ELEVATED THE REMAINDER OF THE DAY.  Radial Site Care  This sheet gives you information about how to care for yourself after your procedure. Your health care provider may also give you more specific instructions. If you have problems or questions, contact your health care provider. What can I expect after the procedure? After the procedure, it is common to have:  Bruising and tenderness at the catheter insertion area. Follow these instructions at home: Medicines  Take over-the-counter and prescription medicines only as told by your health care provider. Insertion site care 1. Follow instructions from your health care provider about how to take care of your insertion site. Make sure you: ? Wash your hands with soap and water before you change your bandage (dressing). If soap and water are not available, use hand sanitizer. ? Change your dressing as told by your health care provider. 2. Check your insertion site every day for signs of infection. Check for: ? Redness, swelling, or pain. ? Fluid or blood. ? Pus or a bad smell. ? Warmth. 3. Do not take baths, swim, or use a hot tub for 5 days. 4. You may shower 24-48 hours after the procedure. ? Remove the dressing and gently wash the site with plain soap and water. ? Pat the area dry with a clean towel. ? Do not rub the site. That could cause bleeding. 5. Do not apply powder or lotion to the site. Activity  1. For 24 hours after the procedure, or as directed by your health care provider: ? Do not flex or bend the affected arm. ? Do not push or pull heavy objects with the affected arm. ? Do not drive yourself home from the hospital or clinic. You may drive 24 hours after the procedure. ? Do not operate machinery or power tools. 2. Do not push, pull or lift anything that is heavier than 10 lb for 5 days. 3. Ask your health care provider when it is okay to: ? Return to work or  school. ? Resume usual physical activities or sports. ? Resume sexual activity. General instructions  If the catheter site starts to bleed, raise your arm and put firm pressure on the site. If the bleeding does not stop, get help right away. This is a medical emergency.  If you went home on the same day as your procedure, a responsible adult should be with you for the first 24 hours after you arrive home.  Keep all follow-up visits as told by your health care provider. This is important. Contact a health care provider if:  You have a fever.  You have redness, swelling, or yellow drainage around your insertion site. Get help right away if:  You have unusual pain at the radial site.  The catheter insertion area swells very fast.  The insertion area is bleeding, and the bleeding does not stop when you hold steady pressure on the area.  Your arm or hand becomes pale, cool, tingly, or numb. These symptoms may represent a serious problem that is an emergency. Do not wait to see if the symptoms will go away. Get medical help right away. Call your local emergency services (911 in the [place] Do not drive yourself to the hospital. Summary  After the procedure, it is common to have bruising and tenderness at the site.  Follow instructions from your health care provider about how to take care of your radial site wound. Check   the wound every day for signs of infection.  Do not push, pull or lift anything that is heavier than 10 lb for 5 days.  This information is not intended to replace advice given to you by your health care provider. Make sure you discuss any questions you have with your health care provider. Document Revised: 09/12/2017 Document Reviewed: 09/12/2017 Elsevier Patient Education  2020 Elsevier Inc[employer] 

## 2020-02-20 NOTE — Progress Notes (Signed)
Called for hematoma proximal to tr band site. Tr band moved proximal using blood pressure cuff. Band at 11cc at 08:46:00  No additional hematoma noted. Due to manual pressure held, there was minimal residual hematoma proximal to the tr band.

## 2020-02-20 NOTE — Progress Notes (Signed)
Hematoma proximal to  TRB. Pressure held x 10 min.  Unresolved. Pressure additional 10 min without resolution. Cath lab called to evaluate.

## 2020-02-20 NOTE — Interval H&P Note (Signed)
History and Physical Interval Note:  02/20/2020 7:05 AM  Derek Marsh  has presented today for surgery, with the diagnosis of chest pain and abnormal stress test.  The various methods of treatment have been discussed with the patient and family. After consideration of risks, benefits and other options for treatment, the patient has consented to  Procedure(s): LEFT HEART CATH AND CORONARY ANGIOGRAPHY (N/A) as a surgical intervention.  The patient's history has been reviewed, patient examined, no change in status, stable for surgery.  I have reviewed the patient's chart and labs.  Questions were answered to the patient's satisfaction.    Cath Lab Visit (complete for each Cath Lab visit)  Clinical Evaluation Leading to the Procedure:   ACS: No.  Non-ACS:    Anginal Classification: CCS IV  Anti-ischemic medical therapy: No Therapy  Non-Invasive Test Results: Low-risk stress test findings: cardiac mortality <1%/year  Prior CABG: No previous CABG  [name]

## 2020-03-16 IMAGING — NM NM HEPATO W/GB/PHARM/[PERSON_NAME]
2 series · 12 of 12 positions shown · non-contrast
Comparison: 01/29/2018 ultrasound.

CLINICAL DATA: 57-year-old male with upset stomach off and on for
years. Diarrhea episodes. Subsequent encounter.

EXAM:
NUCLEAR MEDICINE HEPATOBILIARY IMAGING WITH GALLBLADDER EF
TECHNIQUE: Sequential images of the abdomen were obtained [DATE] minutes
following intravenous administration of radiopharmaceutical. After
oral ingestion of Ensure, gallbladder ejection fraction was
determined. At 60 min, normal ejection fraction is greater than 33%.
RADIOPHARMACEUTICALS:  5.0 mCi 2c-IIm  Choletec IV

[Series 1: biliary · 3.25mm/px · 6 of 60 frames shown]
[frame 6/60]
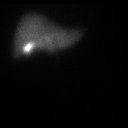
[frame 16/60]
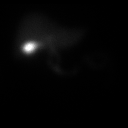
[frame 26/60]
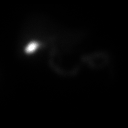
[frame 36/60]
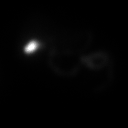
[frame 46/60]
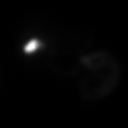
[frame 56/60]
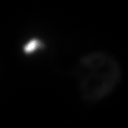

[Series 2: gbef · 3.25mm/px · 6 of 60 frames shown]
[frame 6/60]
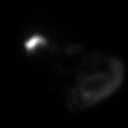
[frame 16/60]
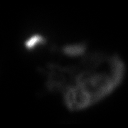
[frame 26/60]
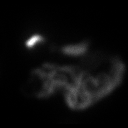
[frame 36/60]
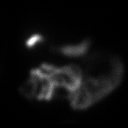
[frame 46/60]
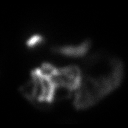
[frame 56/60]
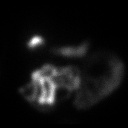

[12 of 12 positions shown; findings below may reference images not displayed]

FINDINGS: Prompt uptake and biliary excretion of activity by the liver is
seen. Gallbladder activity is visualized, consistent with patency of
cystic duct. Biliary activity passes into small bowel, consistent
with patent common bile duct.

Calculated gallbladder ejection fraction is 73%. (Normal gallbladder
ejection fraction with Ensure is greater than 33%.) Patient did not
experience discomfort with ingestion of Ensure.
IMPRESSION: Normal gallbladder ejection fraction.

## 2020-03-22 ENCOUNTER — Encounter: Payer: Self-pay | Admitting: Cardiology

## 2020-03-22 ENCOUNTER — Ambulatory Visit (INDEPENDENT_AMBULATORY_CARE_PROVIDER_SITE_OTHER): Admitting: Cardiology

## 2020-03-22 ENCOUNTER — Other Ambulatory Visit: Payer: Self-pay

## 2020-03-22 VITALS — BP 120/74 | HR 89 | Ht 70.0 in | Wt 273.2 lb

## 2020-03-22 DIAGNOSIS — E782 Mixed hyperlipidemia: Secondary | ICD-10-CM | POA: Diagnosis not present

## 2020-03-22 DIAGNOSIS — Z87898 Personal history of other specified conditions: Secondary | ICD-10-CM | POA: Diagnosis not present

## 2020-03-22 NOTE — Patient Instructions (Addendum)
Medication Instructions:   Your physician recommends that you continue on your current medications as directed. Please refer to the Current Medication list given to you today.  Labwork:  NONE  Testing/Procedures:  NONE  Follow-Up:  Your physician recommends that you schedule a follow-up appointment in: as needed.   Any Other Special Instructions Will Be Listed Below (If Applicable).  If you need a refill on your cardiac medications before your next appointment, please call your pharmacy. 

## 2020-03-22 NOTE — Progress Notes (Signed)
Cardiology Office Note  Date: 03/22/2020   ID: Derek Marsh, DOB 1960-10-11, MRN [id number]  PCP:  Benita Stabile, MD  Cardiologist:  Nona Dell, MD Electrophysiologist:  None   Chief Complaint  Patient presents with  . Follow-up cardiac catheterization    History of Present Illness: Derek Marsh is a 59 y.o. male last seen in July and referred for a cardiac catheterization with history of chest pain and abnormal Myoview.  Procedure was performed by Dr. Okey Dupre on July 2 and fortunately revealed no significant, obstructive CAD.  He presents for follow-up today, overall reassured by the results of testing.  I did talk with him about risk factor reduction, possibility of microvascular dysfunction, also stress reduction.  Weight loss would be important, also regular exercise.  He has been compliant with medications as listed below.  Past Medical History:  Diagnosis Date  . Barrett's esophagus   . Depression   . Essential hypertension   . GERD (gastroesophageal reflux disease)   . Hyperlipidemia   . Low testosterone   . Obesity   . OSA (obstructive sleep apnea)    uses CPAP at 13  . Type 2 diabetes mellitus (HCC)   . Ventral hernia     Past Surgical History:  Procedure Laterality Date  . BIOPSY  05/22/2018   Procedure: BIOPSY;  Surgeon: Corbin Ade, MD;  [facility]: [facility];  Service: Endoscopy;;  distal,esophagus  . COLONOSCOPY  11/13/2011   Dr. Jena Gauss: diverticulosis, hemorrhoids. next TCS 10/2021  . ESOPHAGOGASTRODUODENOSCOPY     Dr. Loreta Ave: says he had GERD  . ESOPHAGOGASTRODUODENOSCOPY N/A 05/22/2018   Dr. Jena Gauss: Small hiatal hernia, Barrett's esophagus without dysplasia.  Next EGD in 3 years.  [name] Kitchen LEFT HEART CATH AND CORONARY ANGIOGRAPHY N/A 02/20/2020   Procedure: LEFT HEART CATH AND CORONARY ANGIOGRAPHY;  Surgeon: Yvonne Kendall, MD;  Location: [facility];  Service: Cardiovascular;  Laterality: N/A;    Current Outpatient Medications  Medication Sig  Dispense Refill  . aspirin EC 81 MG tablet Take 1 tablet (81 mg total) by mouth daily. Swallow whole. 90 tablet 3  . b complex vitamins tablet Take 1 tablet by mouth daily.    . celecoxib (CELEBREX) 100 MG capsule Take 100 mg by mouth 2 (two) times daily.    . cetirizine (ZYRTEC) 10 MG tablet Take 10 mg by mouth at bedtime.     [name] Kitchen desvenlafaxine (PRISTIQ) 50 MG 24 hr tablet Take 50 mg by mouth every evening.     [name] Kitchen GLUCOS-CHONDROIT-MSM-C-HYAL PO Take 2 tablets by mouth at bedtime.    . mometasone (NASONEX) 50 MCG/ACT nasal spray Place 2 sprays into the nose at bedtime.     . Multiple Vitamin (MULITIVITAMIN WITH MINERALS) TABS Take 1 tablet by mouth at bedtime.     . Omega-3 Fatty Acids (FISH OIL PO) Take 2,000 mg by mouth every evening.     [name] Kitchen OVER THE COUNTER MEDICATION Take 100 mg by mouth 2 (two) times daily. CBD    . Probiotic Product (PROBIOTIC DAILY PO) Take 1 capsule by mouth at bedtime. 50 Billion    . RABEprazole (ACIPHEX) 20 MG tablet Take 20 mg by mouth in the morning and at bedtime.     . RYBELSUS 7 MG TABS Take 7 mg by mouth in the morning.     . simvastatin (ZOCOR) 20 MG tablet Take 20 mg by mouth at bedtime.    [name] Kitchen SYNJARDY 12.5-500 MG TABS Take 1  tablet by mouth in the morning and at bedtime.    . tadalafil (CIALIS) 20 MG tablet Take 20 mg by mouth daily as needed for erectile dysfunction.    . Testosterone 10 MG/ACT (2%) GEL Apply 4 mg topically daily. Pump  0  . TURMERIC PO Take 1 tablet by mouth 2 (two) times daily.     . valsartan-hydrochlorothiazide (DIOVAN-HCT) 80-12.5 MG per tablet Take 1 tablet by mouth at bedtime.      Current Facility-Administered Medications  Medication Dose Route Frequency Provider Last Rate Last Admin  . sodium chloride flush (NS) 0.9 % injection 3 mL  3 mL Intravenous Q12H Jonelle Sidle, MD       Allergies:  Patient has no known allergies.   ROS:  No palpitations or syncope.  Physical Exam: VS:  BP 120/74   Pulse 89   Ht 5\' 10"  (1.778 m)    Wt (!) 273 lb 3.2 oz (123.9 kg)   SpO2 93%   BMI 39.20 kg/m , BMI Body mass index is 39.2 kg/m.  Wt Readings from Last 3 Encounters:  03/22/20 (!) 273 lb 3.2 oz (123.9 kg)  02/20/20 260 lb (117.9 kg)  02/19/20 263 lb (119.3 kg)    Visit was spent in discussion today.  ECG:  An ECG dated 02/19/2020 was personally reviewed today and demonstrated:  Normal sinus rhythm.  Recent Labwork: 02/19/2020: BUN 21; Creatinine, Ser 0.83; Hemoglobin 15.3; Platelets 170; Potassium 4.1; Sodium 135   Other Studies Reviewed Today:  Cardiac catheterization 02/20/2020: Conclusions: 1. No angiographically significant coronary artery disease.  The coronary arteries are large with somewhat sluggish flow, which may be a normal variant or reflect an element of microvascular dysfunction. 2. Normal left ventricular systolic function and filling pressure.  Recommendations: 1. Primary prevention of coronary artery disease.  I suspect fixed inferior defect on recent stress test represents diaphragmatic attenuation. 2. Ongoing follow-up of chest pain per Drs. Derion Kreiter and [place].  Assessment and Plan:  1.  History of chest pain, fortunately with cardiac catheterization demonstrating no significant, obstructive CAD.  Could potentially have microvascular dysfunction and further risk factor modification including weight loss, exercise, and optimization of control of type 2 diabetes mellitus and lipid status suggested.  He will continue to follow with Dr. Faribault.  2.  Mixed hyperlipidemia, he is on Zocor with LDL 40.  Medication Adjustments/Labs and Tests Ordered: Current medicines are reviewed at length with the patient today.  Concerns regarding medicines are outlined above.   Tests Ordered: No orders of the defined types were placed in this encounter.   Medication Changes: No orders of the defined types were placed in this encounter.   Disposition:  Follow up prn  Signed, Margo Aye, MD,  [facility] 03/22/2020 9:33 AM    [facility] at [facility] [address] [place], [place], [place] Phone: [phone]; Fax: [phone]

## 2020-09-08 ENCOUNTER — Other Ambulatory Visit: Payer: Self-pay

## 2020-09-08 ENCOUNTER — Ambulatory Visit (INDEPENDENT_AMBULATORY_CARE_PROVIDER_SITE_OTHER)

## 2020-09-08 ENCOUNTER — Encounter: Payer: Self-pay | Admitting: Podiatry

## 2020-09-08 ENCOUNTER — Ambulatory Visit (INDEPENDENT_AMBULATORY_CARE_PROVIDER_SITE_OTHER): Admitting: Podiatry

## 2020-09-08 ENCOUNTER — Other Ambulatory Visit: Payer: Self-pay | Admitting: Podiatry

## 2020-09-08 DIAGNOSIS — M6528 Calcific tendinitis, other site: Secondary | ICD-10-CM | POA: Diagnosis not present

## 2020-09-08 DIAGNOSIS — M6788 Other specified disorders of synovium and tendon, other site: Secondary | ICD-10-CM

## 2020-09-08 DIAGNOSIS — M216X1 Other acquired deformities of right foot: Secondary | ICD-10-CM

## 2020-09-08 DIAGNOSIS — M21861 Other specified acquired deformities of right lower leg: Secondary | ICD-10-CM

## 2020-09-08 DIAGNOSIS — M722 Plantar fascial fibromatosis: Secondary | ICD-10-CM

## 2020-09-08 NOTE — Patient Instructions (Signed)

## 2020-09-09 NOTE — Progress Notes (Signed)
  Subjective:  Patient ID: Derek Marsh, male    DOB: 1961-03-09,  MRN: [id number]  Chief Complaint  Patient presents with  . Foot Pain    Patient presents today for right heel pain x 1 month.  He says it starts at the back of the heel and radiates to the lateral side of foot.  Its really painful in the mornings and when standing from sitting and is sharp and achy.  He has been using K-tape, Celebrex, cbd and stretching    60 y.o. male presents with the above complaint. History confirmed with patient.   Objective:  Physical Exam: warm, good capillary refill, no trophic changes or ulcerative lesions, normal DP and PT pulses and normal sensory exam.   Right Foot:  Gastrocnemius equinus is present, He has pain on palpation to the Achilles insertion, no plantar heel pain   Radiographs: X-ray of the right foot: no fracture, dislocation, swelling or degenerative changes noted, plantar calcaneal spur and posterior calcaneal spur Assessment:   1. Achilles tendinosis   2. Calcific Achilles tendinitis      Plan:  Patient was evaluated and treated and all questions answered.  Insertional Achilles tendinitis right -XR reviewed with patient -Educated on stretching and icing of the affected limb. -He currently takes Celebrex and he continue this for this issue -Recommend use of Voltaren gel -Recommended Tuli's heel cups which he will get -Recommended a night splint and he will get this himself (do not have a DME contract with [facility])  Return in about 6 weeks (around 10/20/2020) for re-check Achilles tendon.

## 2020-10-20 ENCOUNTER — Ambulatory Visit (INDEPENDENT_AMBULATORY_CARE_PROVIDER_SITE_OTHER): Admitting: Podiatry

## 2020-10-20 ENCOUNTER — Other Ambulatory Visit: Payer: Self-pay

## 2020-10-20 ENCOUNTER — Encounter: Payer: Self-pay | Admitting: Podiatry

## 2020-10-20 DIAGNOSIS — M6788 Other specified disorders of synovium and tendon, other site: Secondary | ICD-10-CM | POA: Diagnosis not present

## 2020-10-24 NOTE — Progress Notes (Signed)
  Subjective:  Patient ID: Derek Marsh, male    DOB: 22-Oct-1960,  MRN: [id number]  Chief Complaint  Patient presents with  . Tendonitis    "its really not any better.  Its ok when I wear my tennis shoes but really painful to wear my work boots"    60 y.o. male presents with the above complaint. History confirmed with patient.   Objective:  Physical Exam: warm, good capillary refill, no trophic changes or ulcerative lesions, normal DP and PT pulses and normal sensory exam.   Right Foot:  Gastrocnemius equinus is present, He has pain on palpation to the Achilles insertion, no plantar heel pain   Radiographs: X-ray of the right foot: no fracture, dislocation, swelling or degenerative changes noted, plantar calcaneal spur and posterior calcaneal spur Assessment:   1. Achilles tendinosis      Plan:  Patient was evaluated and treated and all questions answered.  Insertional Achilles tendinitis right -He will benefit most from physical therapy, referral was sent for start physical therapy for him. -Follow-up in 8 weeks after physical therapy -We discussed further treatment beyond what we are currently doing including shockwave ultrasound, radiofrequency ablation as well as surgical intervention.  He is keen to avoid surgery and I understand this.  Did inquire about platelet rich plasma injection, could consider this is assessment we are going to send off her with our practice. -He currently takes Celebrex and he continue this for this issue -Continue use of Voltaren gel -Continue Tuli's heel cups which he will get -Continue night splint this has been helpful  No follow-ups on file.

## 2020-12-15 ENCOUNTER — Ambulatory Visit: Admitting: Podiatry

## 2020-12-22 ENCOUNTER — Other Ambulatory Visit: Payer: Self-pay

## 2020-12-22 ENCOUNTER — Ambulatory Visit: Admitting: Podiatry

## 2020-12-22 DIAGNOSIS — M216X1 Other acquired deformities of right foot: Secondary | ICD-10-CM

## 2020-12-22 DIAGNOSIS — M6788 Other specified disorders of synovium and tendon, other site: Secondary | ICD-10-CM | POA: Diagnosis not present

## 2020-12-22 DIAGNOSIS — M6528 Calcific tendinitis, other site: Secondary | ICD-10-CM

## 2020-12-22 DIAGNOSIS — M21861 Other specified acquired deformities of right lower leg: Secondary | ICD-10-CM

## 2020-12-27 ENCOUNTER — Encounter: Payer: Self-pay | Admitting: Podiatry

## 2020-12-27 NOTE — Progress Notes (Signed)
  Subjective:  Patient ID: Derek Marsh, male    DOB: 10-24-1960,  MRN: [id number]  Chief Complaint  Patient presents with  . Tendonitis    8 week follow up Achilles tendonitis    60 y.o. male returns with the above complaint. History confirmed with patient.  He is doing much better, dry needling was very helpful  Objective:  Physical Exam: warm, good capillary refill, no trophic changes or ulcerative lesions, normal DP and PT pulses and normal sensory exam.   Right Foot: Equinus is improved, he has no pain today   Radiographs: X-ray of the right foot: no fracture, dislocation, swelling or degenerative changes noted, plantar calcaneal spur and posterior calcaneal spur Assessment:   1. Achilles tendinosis   2. Calcific Achilles tendinitis   3. Gastrocnemius equinus of right lower extremity      Plan:  Patient was evaluated and treated and all questions answered.  Insertional Achilles tendinitis right -Doing very well and is essentially pain-free after physical therapy.  Return as needed and resume stretching exercises if it returns.  He has a few more sessions of PT he will finish these  Return if symptoms worsen or fail to improve.

## 2021-03-22 ENCOUNTER — Other Ambulatory Visit: Payer: Self-pay

## 2021-03-22 ENCOUNTER — Ambulatory Visit
Admission: EM | Admit: 2021-03-22 | Discharge: 2021-03-22 | Disposition: A | Discharge status: Home / Self Care | Attending: Family Medicine | Admitting: Family Medicine

## 2021-03-22 DIAGNOSIS — M62830 Muscle spasm of back: Secondary | ICD-10-CM | POA: Diagnosis not present

## 2021-03-22 MED ORDER — TIZANIDINE HCL 2 MG PO TABS: 2.0000 mg | ORAL_TABLET | Freq: Three times a day (TID) | ORAL | 0 refills | Status: AC | PRN

## 2021-03-22 NOTE — ED Triage Notes (Signed)
Pt states that he has a history of back pain. X3 days

## 2021-03-22 NOTE — Discharge Instructions (Addendum)
Salonspos lidocaine patches.

## 2021-03-23 NOTE — ED Provider Notes (Signed)
[facility]    CSN: [id number] Arrival date & time: 03/22/21  1815      History   Chief Complaint Chief Complaint  Patient presents with   Back Pain    Pt states that he has some back pain. X3 days    HPI Derek Marsh is a 60 y.o. male.   HPI Patient presents today with flare up of chronic back pain. Recently had a massage which relieved pain temporarily. He continues have intermittent back spasms. Taken flexeril in the past however caused severe downiness with minimal pain relief. Able to walk. Pain is worst with positional changes and lying down. No focal neurological symptoms  Past Medical History:  Diagnosis Date   Barrett's esophagus    Depression    Essential hypertension    GERD (gastroesophageal reflux disease)    Hyperlipidemia    Low testosterone    Obesity    OSA (obstructive sleep apnea)    uses CPAP at 13   Type 2 diabetes mellitus (HCC)    Ventral hernia     Patient Active Problem List   Diagnosis Date Noted   Abnormal stress test 02/20/2020   GERD (gastroesophageal reflux disease) 05/03/2018   LUQ pain 05/03/2018   N&V (nausea and vomiting) 05/03/2018   Fatty liver 05/03/2018   Abnormal LFTs 05/03/2018   Essential hypertension 09/04/2014   Type 2 diabetes mellitus (HCC) 09/04/2014   Precordial chest pain 09/04/2014   Mixed hyperlipidemia 02/25/2009   OBESITY 02/25/2009   OBSTRUCTIVE SLEEP APNEA 02/25/2009    Past Surgical History:  Procedure Laterality Date   BIOPSY  05/22/2018   Procedure: BIOPSY;  Surgeon: Corbin Ade, MD;  Location: [facility];  Service: Endoscopy;;  distal,esophagus   COLONOSCOPY  11/13/2011   Dr. Jena Gauss: diverticulosis, hemorrhoids. next TCS 10/2021   ESOPHAGOGASTRODUODENOSCOPY     Dr. Loreta Ave: says he had GERD   ESOPHAGOGASTRODUODENOSCOPY N/A 05/22/2018   Dr. Jena Gauss: Small hiatal hernia, Barrett's esophagus without dysplasia.  Next EGD in 3 years.   LEFT HEART CATH AND CORONARY ANGIOGRAPHY N/A 02/20/2020    Procedure: LEFT HEART CATH AND CORONARY ANGIOGRAPHY;  Surgeon: Yvonne Kendall, MD;  Location: [facility];  Service: Cardiovascular;  Laterality: N/A;       Home Medications    Prior to Admission medications   Medication Sig Start Date End Date Taking? Authorizing Provider  aspirin EC 81 MG tablet Take 1 tablet (81 mg total) by mouth daily. Swallow whole. 02/19/20  Yes Jonelle Sidle, MD  b complex vitamins tablet Take 1 tablet by mouth daily.   Yes [provider]  buPROPion (WELLBUTRIN XL) 150 MG 24 hr tablet Take 150 mg by mouth every morning. 08/16/20  Yes [provider]  celecoxib (CELEBREX) 100 MG capsule Take 100 mg by mouth 2 (two) times daily.   Yes [provider]  cetirizine (ZYRTEC) 10 MG tablet Take 10 mg by mouth at bedtime.    Yes [provider]  desvenlafaxine (PRISTIQ) 50 MG 24 hr tablet Take 50 mg by mouth every evening.    Yes [provider]  GLUCOS-CHONDROIT-MSM-C-HYAL PO Take 2 tablets by mouth at bedtime.   Yes [provider]  mometasone (NASONEX) 50 MCG/ACT nasal spray Place 2 sprays into the nose at bedtime.    Yes [provider]  Multiple Vitamin (MULITIVITAMIN WITH MINERALS) TABS Take 1 tablet by mouth at bedtime.    Yes [provider]  Omega-3 Fatty Acids (  FISH OIL PO) Take 2,000 mg by mouth every evening.    Yes [provider]  OVER THE COUNTER MEDICATION Take 100 mg by mouth 2 (two) times daily. CBD   Yes [provider]  Probiotic Product (PROBIOTIC DAILY PO) Take 1 capsule by mouth at bedtime. 50 Billion   Yes [provider]  RABEprazole (ACIPHEX) 20 MG tablet Take 20 mg by mouth in the morning and at bedtime.  11/28/19  Yes [provider]  RYBELSUS 7 MG TABS Take 7 mg by mouth in the morning.  12/31/19  Yes [provider]  simvastatin (ZOCOR) 20 MG tablet Take 20 mg by mouth at bedtime.   Yes [provider]  SYNJARDY  12.5-500 MG TABS Take 1 tablet by mouth in the morning and at bedtime. 02/23/20  Yes End, Cristal Deer, MD  tadalafil (CIALIS) 20 MG tablet Take 20 mg by mouth daily as needed for erectile dysfunction.   Yes [provider]  Testosterone 10 MG/ACT (2%) GEL Apply 4 mg topically daily. Pump 04/23/18  Yes [provider]  tiZANidine (ZANAFLEX) 2 MG tablet Take 1-2 tablets (2-4 mg total) by mouth every 8 (eight) hours as needed for muscle spasms. 03/22/21  Yes Bing Neighbors, FNP  TURMERIC PO Take 1 tablet by mouth 2 (two) times daily.    Yes [provider]  valsartan-hydrochlorothiazide (DIOVAN-HCT) 80-12.5 MG per tablet Take 1 tablet by mouth at bedtime.    Yes [provider]    Family History Family History  Problem Relation Age of Onset   Diabetes Other    Other Daughter        had subtotal colectomy for severe constipation   Colon cancer Neg Hx    Liver disease Neg Hx     Social History Social History   Tobacco Use   Smoking status: Former    Packs/day: 2.00    Years: 15.00    Pack years: 30.00    Types: Cigarettes    Start date: 08/24/1977    Quit date: 10/20/2006    Years since quitting: 14.4   Smokeless tobacco: Never  Substance Use Topics   Alcohol use: Yes    Alcohol/week: 0.0 standard drinks    Comment: rare   Drug use: Never     Allergies   Patient has no known allergies.   Review of Systems Review of Systems Pertinent negatives listed in HPI   Physical Exam Triage Vital Signs ED Triage Vitals  Enc Vitals Group     BP 03/22/21 1847 126/81     Pulse Rate 03/22/21 1847 75     Resp 03/22/21 1847 18     Temp 03/22/21 1847 98.7 F (37.1 C)     Temp Source 03/22/21 1847 Oral     SpO2 03/22/21 1847 95 %     Weight 03/22/21 1846 300 lb (136.1 kg)     Height 03/22/21 1846 5\' 10"  (1.778 m)     Head Circumference --      Peak Flow --      Pain Score 03/22/21 1846 6     Pain Loc --      Pain Edu? --      Excl. in GC? --     No data found.  Updated Vital Signs BP 126/81 (BP Location: Right Arm)   Pulse 75   Temp 98.7 F (37.1 C) (Oral)   Resp 18   Ht 5\' 10"  (1.778 m)   Wt 300  lb (136.1 kg)   SpO2 95%   BMI 43.05 kg/m   Visual Acuity Right Eye Distance:   Left Eye Distance:   Bilateral Distance:    Right Eye Near:   Left Eye Near:    Bilateral Near:     Physical Exam Constitutional:      Appearance: He is obese.  Eyes:     Pupils: Pupils are equal, round, and reactive to light.  Cardiovascular:     Rate and Rhythm: Normal rate.  Pulmonary:     Effort: Pulmonary effort is normal.     Breath sounds: Normal breath sounds.  Musculoskeletal:     Cervical back: Normal range of motion.  Skin:    Capillary Refill: Capillary refill takes less than 2 seconds.  Neurological:     General: No focal deficit present.     Mental Status: He is alert and oriented to person, place, and time.     Gait: Gait normal.  Psychiatric:        Mood and Affect: Mood normal.        Behavior: Behavior normal.        Thought Content: Thought content normal.        Judgment: Judgment normal.    UC Treatments / Results  Labs (all labs ordered are listed, but only abnormal results are displayed) Labs Reviewed - No data to display  EKG   Radiology No results found.  Procedures Procedures (including critical care time)  Medications Ordered in UC Medications - No data to display  Initial Impression / Assessment and Plan / UC Course  I have reviewed the triage vital signs and the nursing notes.  Pertinent labs & imaging results that were available during my care of the patient were reviewed by me and considered in my medical decision making (see chart for details).    Back spasm. Trial tizanidine- may still cause some degree of drowsiness, avoid driving whole taking medication. Trial lidocaine patches. PCP follow-up as needed. Final Clinical Impressions(s) / UC Diagnoses   Final diagnoses:  Spasm  of muscle of lower back     Discharge Instructions      [facility]pos lidocaine patches.   ED Prescriptions     Medication Sig Dispense Auth. Provider   tiZANidine (ZANAFLEX) 2 MG tablet Take 1-2 tablets (2-4 mg total) by mouth every 8 (eight) hours as needed for muscle spasms. 40 tablet Bing Neighbors, FNP      PDMP not reviewed this encounter.   Bing Neighbors, FNP 03/23/21 [phone]

## 2021-05-02 ENCOUNTER — Encounter: Payer: Self-pay | Admitting: *Deleted

## 2021-09-26 ENCOUNTER — Encounter: Payer: Self-pay | Admitting: Internal Medicine

## 2022-01-17 ENCOUNTER — Ambulatory Visit (INDEPENDENT_AMBULATORY_CARE_PROVIDER_SITE_OTHER): Admitting: Gastroenterology

## 2022-01-17 ENCOUNTER — Encounter: Payer: Self-pay | Admitting: Gastroenterology

## 2022-01-17 ENCOUNTER — Telehealth: Payer: Self-pay | Admitting: Gastroenterology

## 2022-01-17 VITALS — BP 110/64 | HR 81 | Temp 97.3°F | Ht 70.0 in | Wt 292.8 lb

## 2022-01-17 DIAGNOSIS — K219 Gastro-esophageal reflux disease without esophagitis: Secondary | ICD-10-CM | POA: Diagnosis not present

## 2022-01-17 DIAGNOSIS — R7989 Other specified abnormal findings of blood chemistry: Secondary | ICD-10-CM

## 2022-01-17 DIAGNOSIS — K2270 Barrett's esophagus without dysplasia: Secondary | ICD-10-CM | POA: Diagnosis not present

## 2022-01-17 DIAGNOSIS — K760 Fatty (change of) liver, not elsewhere classified: Secondary | ICD-10-CM

## 2022-01-17 NOTE — Progress Notes (Signed)
GI Office Note    Referring Provider: Benita Stabile, MD Primary Care Physician:  Benita Stabile, MD  Primary Gastroenterologist: Roetta Sessions, MD   Chief Complaint   Chief Complaint  Patient presents with   Colonoscopy    Last colonoscopy was 2013 and last endoscopy was 2019.     History of Present Illness   Derek Marsh is a 61 y.o. male presenting today at the request of Dr. Scharlene Gloss office to schedule EGD and colonoscopy.  He has a history of GERD with short segment Barrett's esophagus, overdue for 3-year surveillance EGD.  Last colonoscopy in 2013. Also with history of fatty liver.   No melena, brbpr. No constipation, diarrhea. No abdominal pain. Not a lot of heartburn, taking aciphex 20mg  BID. New insurance made him switch from Dexilant to Aciphex 20mg  BID. Works better anyway. No dysphagia. No vomiting. Wife has Alzheimer's, stress relief is eating. Weight going up and down. Working FT, 40-50 hours per week.  Labs from January 2023: White blood cell count 6800, hemoglobin 17.4, platelets 200,000, glucose 174, BUN 15, creatinine 1.03, albumin 5.1, total bilirubin 0.6, alkaline phosphatase 143, AST 27, ALT 45, A1c 7.3.  Back in 2019 he had normal iron, iron saturations.  Negative anti-smooth muscle antibody, negative mitochondrial antibody, hepatitis C antibody negative, ANA negative, ferritin 68, hepatitis B surface antigen negative.  July 2021: 260 pounds Aug 2021: 273 pounds Aug 2022: 300 pounds May 2023: 292 pounds   Medications   Current Outpatient Medications  Medication Sig Dispense Refill   aspirin EC 81 MG tablet Take 1 tablet (81 mg total) by mouth daily. Swallow whole. 90 tablet 3   b complex vitamins tablet Take 1 tablet by mouth daily.     buPROPion (WELLBUTRIN XL) 150 MG 24 hr tablet Take 150 mg by mouth every morning.     celecoxib (CELEBREX) 100 MG capsule Take 100 mg by mouth 2 (two) times daily.     cetirizine (ZYRTEC) 10 MG tablet Take 10 mg by mouth  at bedtime.      desvenlafaxine (PRISTIQ) 50 MG 24 hr tablet Take 50 mg by mouth every evening.      GLUCOS-CHONDROIT-MSM-C-HYAL PO Take 2 tablets by mouth at bedtime.     mometasone (NASONEX) 50 MCG/ACT nasal spray Place 2 sprays into the nose at bedtime.      Multiple Vitamin (MULITIVITAMIN WITH MINERALS) TABS Take 1 tablet by mouth at bedtime.      Omega-3 Fatty Acids (FISH OIL PO) Take 2,000 mg by mouth every evening.      OVER THE COUNTER MEDICATION Take 100 mg by mouth 2 (two) times daily. CBD     Probiotic Product (PROBIOTIC DAILY PO) Take 1 capsule by mouth at bedtime. 50 Billion     RABEprazole (ACIPHEX) 20 MG tablet Take 20 mg by mouth in the morning and at bedtime.      rosuvastatin (CRESTOR) 20 MG tablet Take 20 mg by mouth at bedtime.     RYBELSUS 7 MG TABS Take 7 mg by mouth in the morning.      SYNJARDY 12.5-500 MG TABS Take 1 tablet by mouth in the morning and at bedtime.     tadalafil (CIALIS) 20 MG tablet Take 20 mg by mouth daily as needed for erectile dysfunction.     Testosterone 10 MG/ACT (2%) GEL Apply 4 mg topically daily. Pump  0   tiZANidine (ZANAFLEX) 2 MG tablet Take 1-2 tablets (2-4 mg  total) by mouth every 8 (eight) hours as needed for muscle spasms. 40 tablet 0   TURMERIC PO Take 1 tablet by mouth 2 (two) times daily.      valsartan-hydrochlorothiazide (DIOVAN-HCT) 80-12.5 MG per tablet Take 1 tablet by mouth at bedtime.      Current Facility-Administered Medications  Medication Dose Route Frequency Provider Last Rate Last Admin   sodium chloride flush (NS) 0.9 % injection 3 mL  3 mL Intravenous Q12H Satira Sark, MD        Allergies   Allergies as of 01/17/2022   (No Known Allergies)    Past Medical History   Past Medical History:  Diagnosis Date   Barrett's esophagus    Depression    Essential hypertension    GERD (gastroesophageal reflux disease)    Hyperlipidemia    Low testosterone    Obesity    OSA (obstructive sleep apnea)    uses  CPAP at 13   Type 2 diabetes mellitus ([place])    Ventral hernia     Past Surgical History   Past Surgical History:  Procedure Laterality Date   BIOPSY  05/22/2018   Procedure: BIOPSY;  Surgeon: Daneil Dolin, MD;  Location: [facility];  Service: Endoscopy;;  distal,esophagus   COLONOSCOPY  11/13/2011   Dr. Gala Romney: diverticulosis, hemorrhoids. next TCS 10/2021   ESOPHAGOGASTRODUODENOSCOPY     Dr. Collene Mares: says he had GERD   ESOPHAGOGASTRODUODENOSCOPY N/A 05/22/2018   Dr. Gala Romney: Small hiatal hernia, Barrett's esophagus without dysplasia.  Next EGD in 3 years.   LEFT HEART CATH AND CORONARY ANGIOGRAPHY N/A 02/20/2020   Procedure: LEFT HEART CATH AND CORONARY ANGIOGRAPHY;  Surgeon: Nelva Bush, MD;  Location: [facility];  Service: Cardiovascular;  Laterality: N/A;    Past Family History   Family History  Problem Relation Age of Onset   Diabetes Other    Other Daughter        had subtotal colectomy for severe constipation   Colon cancer Neg Hx    Liver disease Neg Hx     Past Social History   Social History   Socioeconomic History   Marital status: Married    Spouse name: Candace    Number of children: Not on file   Years of education: Not on file   Highest education level: Bachelor's degree (e.g., BA, AB, BS)  Occupational History   Occupation: Tour manager  Tobacco Use   Smoking status: Former    Packs/day: 2.00    Years: 15.00    Pack years: 30.00    Types: Cigarettes    Start date: 08/24/1977    Quit date: 10/20/2006    Years since quitting: 15.2   Smokeless tobacco: Never  Substance and Sexual Activity   Alcohol use: Yes    Alcohol/week: 0.0 standard drinks    Comment: rare   Drug use: Never   Sexual activity: Yes  Other Topics Concern   Not on file  Social History Narrative   2 cups of coffee per day   BS degree   Social Determinants of Health   Financial Resource Strain: Not on file  Food Insecurity: Not on file  Transportation  Needs: Not on file  Physical Activity: Not on file  Stress: Not on file  Social Connections: Not on file  Intimate Partner Violence: Not on file    Review of Systems   General: Negative for anorexia, weight loss, fever, chills, fatigue, weakness. Eyes: Negative for vision changes.  ENT: Negative for hoarseness, difficulty swallowing , nasal congestion. CV: Negative for chest pain, angina, palpitations, dyspnea on exertion, peripheral edema.  Respiratory: Negative for dyspnea at rest, dyspnea on exertion, cough, sputum, wheezing.  GI: See history of present illness. GU:  Negative for dysuria, hematuria, urinary incontinence, urinary frequency, nocturnal urination.  MS: Negative for joint pain, low back pain.  Derm: Negative for rash or itching.  Neuro: Negative for weakness, abnormal sensation, seizure, frequent headaches, memory loss,  confusion.  Psych: Negative for anxiety,  suicidal ideation, hallucinations. +depression, stress with wife's illness Endo: Negative for unusual weight change.  Heme: Negative for bruising or bleeding. Allergy: Negative for rash or hives.  Physical Exam   Pulse 81   Temp (!) 97.3 F (36.3 C) (Temporal)   Ht $R'5\' 10"'hM$  (1.778 m)   Wt 292 lb 12.8 oz (132.8 kg)   SpO2 96%   BMI 42.01 kg/m    General: Well-nourished, well-developed in no acute distress.  Head: Normocephalic, atraumatic.   Eyes: Conjunctiva pink, no icterus. Mouth: Oropharyngeal mucosa moist and pink , no lesions erythema or exudate. Neck: Supple without thyromegaly, masses, or lymphadenopathy.  Lungs: Clear to auscultation bilaterally.  Heart: Regular rate and rhythm, no murmurs rubs or gallops.  Abdomen: Bowel sounds are normal, nontender, nondistended, no hepatosplenomegaly or masses,  no abdominal bruits or hernia, no rebound or guarding.  Diastatis recti. Rectal: not performed Extremities: No lower extremity edema. No clubbing or deformities.  Neuro: Alert and oriented x 4 ,  grossly normal neurologically.  Skin: Warm and dry, no rash or jaundice.   Psych: Alert and cooperative, normal mood and affect.  Labs   See hpi  Imaging Studies   No results found.  Assessment   GERD/Barrett's: reflux controlled on Aciphex BID. Last EGD in 2019 and overdue for surveillance for Barrett's.   Screening colonoscopy: last colonoscopy in 2013. Due for screening.   Fatty liver/elevated LFTs: chronically mildly elevated ALT. In 08/2021, alk phos elevated in the 140s. Prior work up with negative Hep B and C markers. No evidence of iron overload and autoimmune work up negative. Given new elevated of AP, recheck AMA. Discussed need for healthier diet, exercise, and weight loss. Will have him come back to see [place] in 9 months.    PLAN   LFTs, AMA.  EGD/colonoscopy in near future. ASA 3.  I have discussed the risks, alternatives, benefits with regards to but not limited to the risk of reaction to medication, bleeding, infection, perforation and the patient is agreeable to proceed. Written consent to be obtained. Patient desires low volume prep and willing to pay out of pocket if needed.  Continue Aciphex $RemoveBeforeDE'20mg'[id number]$  BID. Return ov in 9 months.   Laureen Ochs. Bobby Rumpf, [place], [facility]

## 2022-01-17 NOTE — Patient Instructions (Addendum)
Upper endoscopy and colonoscopy with Dr. Jena Gauss. To be scheduled, see separate instructions.  Continue Aciphex 20mg  twice daily before a meal. Please have labs as discussed. If you have not heard from within a week of labs, please call and make sure we received results.  Continue to work on eating healthy, staying active, slow gradual weight loss for overall health but also for fatty liver.  Return office visit in 9 months for fatty liver.      Nonalcoholic Fatty Liver Disease Diet, Adult Nonalcoholic fatty liver disease is a condition that causes fat to build up in and around the liver. The disease makes it harder for the liver to work the way that it should. Following a healthy diet can help to keep nonalcoholic fatty liver disease under control. It can also help to prevent or improve conditions that are associated with the disease, such as heart disease, diabetes, high blood pressure, and abnormal cholesterol levels. Along with regular exercise, this diet: Promotes weight loss. Helps to control blood sugar levels. Helps to improve the way that the body uses insulin. What are tips for following this plan? Reading food labels Always check food labels for: The amount of saturated fat in a food. You should limit your intake of saturated fat. Saturated fat is found in foods that come from animals, including meat and dairy products such as butter, cheese, and whole milk. The amount of fiber in a food. You should choose high-fiber foods such as fruits, vegetables, and whole grains. Try to get 25-30 grams (g) of fiber a day.  Cooking When cooking, use heart-healthy oils that are high in monounsaturated fats. These include olive oil, canola oil, and avocado oil. Limit frying or deep-frying foods. Cook foods using healthy methods such as baking, boiling, steaming, and grilling instead. Meal planning You may want to keep track of how many calories you take in. Eating the right amount of calories  will help you achieve a healthy weight. Meeting with a registered dietitian can help you get started. Limit how often you eat takeout and fast food. These foods are usually very high in fat, salt, and sugar. Use the glycemic index (GI) to plan your meals. The index tells you how quickly a food will raise your blood sugar. Choose low-GI foods (GI less than 55). These foods take a longer time to raise blood sugar. A registered dietitian can help you identify foods lower on the GI scale. Lifestyle You may want to follow a Mediterranean diet. This diet includes a lot of vegetables, lean meats or fish, whole grains, fruits, and healthy oils and fats. What foods can I eat?  Fruits Bananas. Apples. Oranges. Grapes. Papaya. Mango. Pomegranate. Kiwi. Grapefruit. Cherries. Vegetables Lettuce. Spinach. Peas. Beets. Cauliflower. Cabbage. Broccoli. Carrots. Tomatoes. Squash. Eggplant. Herbs. Peppers. Onions. Cucumbers. Brussels sprouts. Yams and sweet potatoes. Beans. Lentils. Grains Whole wheat or whole-grain foods, including breads, crackers, cereals, and pasta. Stone-ground whole wheat. Unsweetened oatmeal. Bulgur. Barley. Quinoa. Brown or wild rice. Corn or whole wheat flour tortillas. Meats and other proteins Lean meats. Poultry. Tofu. Seafood and shellfish. Dairy Low-fat or fat-free dairy products, such as yogurt, cottage cheese, or cheese. Beverages Water. Sugar-free drinks. Tea. Coffee. Low-fat or skim milk. Milk alternatives, such as soy or almond milk. Real fruit juice. Fats and oils Avocado. Canola or olive oil. Nuts and nut butters. Seeds. Seasonings and condiments Mustard. Relish. Low-fat, low-sugar ketchup and barbecue sauce. Low-fat or fat-free mayonnaise. Sweets and desserts Sugar-free sweets. The items  listed above may not be a complete list of foods and beverages you can eat. Contact a dietitian for more information. What foods should I limit or avoid? Meats and other proteins Limit  red meat to 1-2 times a week. Dairy [employer]. Fats and oils Palm oil and coconut oil. Fried foods. Other foods Processed foods. Foods that contain a lot of salt or sodium. Sweets and desserts Sweets that contain sugar. Beverages Sweetened drinks, such as sweet tea, milkshakes, iced sweet drinks, and sodas. Alcohol. The items listed above may not be a complete list of foods and beverages you should avoid. Contact a dietitian for more information. Where to find more information The [employer] of Diabetes and Digestive and Kidney Diseases: [email or web address].si Summary Nonalcoholic fatty liver disease is a condition that causes fat to build up in and around the liver. Following a healthy diet can help to keep nonalcoholic fatty liver disease under control. Your diet should be rich in fruits, vegetables, whole grains, and lean proteins. Limit your intake of saturated fat. Saturated fat is found in foods that come from animals, including meat and dairy products such as butter, cheese, and whole milk. This diet promotes weight loss, helps to control blood sugar levels, and helps to improve the way that the body uses insulin. This information is not intended to replace advice given to you by your health care provider. Make sure you discuss any questions you have with your health care provider. Document Revised: 11/29/2018 Document Reviewed: 08/29/2018 Elsevier Patient Education  2023 [employer].

## 2022-01-17 NOTE — Telephone Encounter (Signed)
Patient needs follow up ov in 9 months.

## 2022-01-26 ENCOUNTER — Telehealth: Payer: Self-pay | Admitting: *Deleted

## 2022-01-26 MED ORDER — CLENPIQ 10-3.5-12 MG-GM -GM/175ML PO SOLN: 1.0000 | ORAL | 0 refills | Status: DC

## 2022-01-26 NOTE — Telephone Encounter (Signed)
Called pt. He has been scheduled for TCS/EGD with Dr. Gala Romney, ASA 3 on 7/14 at 7:30am. Aware will mail prep instructions/pre-op appt. Rx sent to pharmacy (pt requested low volume and willing to pay OOP)

## 2022-01-27 ENCOUNTER — Encounter: Payer: Self-pay | Admitting: *Deleted

## 2022-02-27 NOTE — Patient Instructions (Signed)
Derek Marsh  02/27/2022     @PREFPERIOPPHARMACY @   Your procedure is scheduled on  03/03/2022.   Report to [facility] at  0600  A.M.   Call this number if you have problems the morning of surgery:  [phone]   Remember:  Follow the diet and prep instructions given to you by the office.    Take these medicines the morning of surgery with A SIP OF WATER                       wellbutrin, celebrex,aciphex.     Do not wear jewelry, make-up or nail polish.  Do not wear lotions, powders, or perfumes, or deodorant.  Do not shave 48 hours prior to surgery.  Men may shave face and neck.  Do not bring valuables to the hospital.  [facility] is not responsible for any belongings or valuables.  Contacts, dentures or bridgework may not be worn into surgery.  Leave your suitcase in the car.  After surgery it may be brought to your room.  For patients admitted to the hospital, discharge time will be determined by your treatment team.  Patients discharged the day of surgery will not be allowed to drive home and must have someone with them for 24 hours.    Special instructions:   DO NOT smoke tobacco or vape for 24 hours before your procedure.  Please read over the following fact sheets that you were given. Anesthesia Post-op Instructions and Care and Recovery After Surgery      Upper Endoscopy, Adult, Care After This sheet gives you information about how to care for yourself after your procedure. Your health care provider may also give you more specific instructions. If you have problems or questions, contact your health care provider. What can I expect after the procedure? After the procedure, it is common to have: A sore throat. Mild stomach pain or discomfort. Bloating. Nausea. Follow these instructions at home:  Follow instructions from your health care provider about what to eat or drink after your procedure. Return to your normal activities as told by your  health care provider. Ask your health care provider what activities are safe for you. Take over-the-counter and prescription medicines only as told by your health care provider. If you were given a sedative during the procedure, it can affect you for several hours. Do not drive or operate machinery until your health care provider says that it is safe. Keep all follow-up visits as told by your health care provider. This is important. Contact a health care provider if you have: A sore throat that lasts longer than one day. Trouble swallowing. Get help right away if: You vomit blood or your vomit looks like coffee grounds. You have: A fever. Bloody, black, or tarry stools. A severe sore throat or you cannot swallow. Difficulty breathing. Severe pain in your chest or abdomen. Summary After the procedure, it is common to have a sore throat, mild stomach discomfort, bloating, and nausea. If you were given a sedative during the procedure, it can affect you for several hours. Do not drive or operate machinery until your health care provider says that it is safe. Follow instructions from your health care provider about what to eat or drink after your procedure. Return to your normal activities as told by your health care provider. This information is not intended to replace advice given to you by your health  care provider. Make sure you discuss any questions you have with your health care provider. Document Revised: 06/13/2019 Document Reviewed: 01/07/2018 Elsevier Patient Education  2023 Else[employer] Inc. Colonoscopy, Adult, Care After The following information offers guidance on how to care for yourself after your procedure. Your health care provider may also give you more specific instructions. If you have problems or questions, contact your health care provider. What can I expect after the procedure? After the procedure, it is common to have: A small amount of blood in your stool for 24 hours  after the procedure. Some gas. Mild cramping or bloating of your abdomen. Follow these instructions at home: Eating and drinking  Drink enough fluid to keep your urine pale yellow. Follow instructions from your health care provider about eating or drinking restrictions. Resume your normal diet as told by your health care provider. Avoid heavy or fried foods that are hard to digest. Activity Rest as told by your health care provider. Avoid sitting for a long time without moving. Get up to take short walks every 1-2 hours. This is important to improve blood flow and breathing. Ask for help if you feel weak or unsteady. Return to your normal activities as told by your health care provider. Ask your health care provider what activities are safe for you. Managing cramping and bloating  Try walking around when you have cramps or feel bloated. If directed, apply heat to your abdomen as told by your health care provider. Use the heat source that your health care provider recommends, such as a moist heat pack or a heating pad. Place a towel between your skin and the heat source. Leave the heat on for 20-30 minutes. Remove the heat if your skin turns bright red. This is especially important if you are unable to feel pain, heat, or cold. You have a greater risk of getting burned. General instructions If you were given a sedative during the procedure, it can affect you for several hours. Do not drive or operate machinery until your health care provider says that it is safe. For the first 24 hours after the procedure: Do not sign important documents. Do not drink alcohol. Do your regular daily activities at a slower pace than normal. Eat soft foods that are easy to digest. Take over-the-counter and prescription medicines only as told by your health care provider. Keep all follow-up visits. This is important. Contact a health care provider if: You have blood in your stool 2-3 days after the  procedure. Get help right away if: You have more than a small spotting of blood in your stool. You have large blood clots in your stool. You have swelling of your abdomen. You have nausea or vomiting. You have a fever. You have increasing pain in your abdomen that is not relieved with medicine. These symptoms may be an emergency. Get help right away. Call 911. Do not wait to see if the symptoms will go away. Do not drive yourself to the hospital. Summary After the procedure, it is common to have a small amount of blood in your stool. You may also have mild cramping and bloating of your abdomen. If you were given a sedative during the procedure, it can affect you for several hours. Do not drive or operate machinery until your health care provider says that it is safe. Get help right away if you have a lot of blood in your stool, nausea or vomiting, a fever, or increased pain in your abdomen. This information  is not intended to replace advice given to you by your health care provider. Make sure you discuss any questions you have with your health care provider. Document Revised: 03/30/2021 Document Reviewed: 03/30/2021 Elsevier Patient Education  2023 Elsevier Inc. Monitored Anesthesia Care, Care After This sheet gives you information about how to care for yourself after your procedure. Your health care provider may also give you more specific instructions. If you have problems or questions, contact your health care provider. What can I expect after the procedure? After the procedure, it is common to have: Tiredness. Forgetfulness about what happened after the procedure. Impaired judgment for important decisions. Nausea or vomiting. Some difficulty with balance. Follow these instructions at home: For the time period you were told by your health care provider:     Rest as needed. Do not participate in activities where you could fall or become injured. Do not drive or use machinery. Do  not drink alcohol. Do not take sleeping pills or medicines that cause drowsiness. Do not make important decisions or sign legal documents. Do not take care of children on your own. Eating and drinking Follow the diet that is recommended by your health care provider. Drink enough fluid to keep your urine pale yellow. If you vomit: Drink water, juice, or soup when you can drink without vomiting. Make sure you have little or no nausea before eating solid foods. General instructions Have a responsible adult stay with you for the time you are told. It is important to have someone help care for you until you are awake and alert. Take over-the-counter and prescription medicines only as told by your health care provider. If you have sleep apnea, surgery and certain medicines can increase your risk for breathing problems. Follow instructions from your health care provider about wearing your sleep device: Anytime you are sleeping, including during daytime naps. While taking prescription pain medicines, sleeping medicines, or medicines that make you drowsy. Avoid smoking. Keep all follow-up visits as told by your health care provider. This is important. Contact a health care provider if: You keep feeling nauseous or you keep vomiting. You feel light-headed. You are still sleepy or having trouble with balance after 24 hours. You develop a rash. You have a fever. You have redness or swelling around the IV site. Get help right away if: You have trouble breathing. You have new-onset confusion at home. Summary For several hours after your procedure, you may feel tired. You may also be forgetful and have poor judgment. Have a responsible adult stay with you for the time you are told. It is important to have someone help care for you until you are awake and alert. Rest as told. Do not drive or operate machinery. Do not drink alcohol or take sleeping pills. Get help right away if you have trouble  breathing, or if you suddenly become confused. This information is not intended to replace advice given to you by your health care provider. Make sure you discuss any questions you have with your health care provider. Document Revised: 07/12/2021 Document Reviewed: 07/10/2019 Elsevier Patient Education  2023 [employer].

## 2022-03-01 ENCOUNTER — Encounter (HOSPITAL_COMMUNITY): Payer: Self-pay

## 2022-03-01 ENCOUNTER — Encounter (HOSPITAL_COMMUNITY)
Admission: RE | Admit: 2022-03-01 | Discharge: 2022-03-01 | Disposition: A | Discharge status: Home / Self Care | Source: Ambulatory Visit | Attending: Internal Medicine | Admitting: Internal Medicine

## 2022-03-01 DIAGNOSIS — R7989 Other specified abnormal findings of blood chemistry: Secondary | ICD-10-CM | POA: Diagnosis not present

## 2022-03-01 DIAGNOSIS — I10 Essential (primary) hypertension: Secondary | ICD-10-CM | POA: Diagnosis not present

## 2022-03-01 DIAGNOSIS — E119 Type 2 diabetes mellitus without complications: Secondary | ICD-10-CM | POA: Diagnosis not present

## 2022-03-01 DIAGNOSIS — Z01818 Encounter for other preprocedural examination: Secondary | ICD-10-CM | POA: Diagnosis not present

## 2022-03-01 LAB — BASIC METABOLIC PANEL
Anion gap: 10 (ref 5–15)
BUN: 17 mg/dL (ref 8–23)
CO2: 22 mmol/L (ref 22–32)
Calcium: 8.9 mg/dL (ref 8.9–10.3)
Chloride: 101 mmol/L (ref 98–111)
Creatinine, Ser: 0.98 mg/dL (ref 0.61–1.24)
GFR, Estimated: >60 mL/min (ref >60)
Glucose, Bld: 326 mg/dL — ABNORMAL HIGH (ref 70–99)
Potassium: 3.9 mmol/L (ref 3.5–5.1)
Sodium: 133 mmol/L — ABNORMAL LOW (ref 135–145)

## 2022-03-01 LAB — HEPATIC FUNCTION PANEL
ALT: 35 U/L (ref 0–44)
AST: 24 U/L (ref 15–41)
Albumin: 4.1 g/dL (ref 3.5–5.0)
Alkaline Phosphatase: 133 U/L — ABNORMAL HIGH (ref 38–126)
Bilirubin, Direct: 0.2 mg/dL (ref 0.0–0.2)
Indirect Bilirubin: 0.8 mg/dL (ref 0.3–0.9)
Total Bilirubin: 1 mg/dL (ref 0.3–1.2)
Total Protein: 7 g/dL (ref 6.5–8.1)

## 2022-03-02 LAB — MITOCHONDRIAL ANTIBODIES: Mitochondrial M2 Ab, IgG: <20 Units (ref 0.0–20.0)

## 2022-03-03 ENCOUNTER — Ambulatory Visit (HOSPITAL_BASED_OUTPATIENT_CLINIC_OR_DEPARTMENT_OTHER): Admitting: Certified Registered"

## 2022-03-03 ENCOUNTER — Encounter (HOSPITAL_COMMUNITY)
Admission: RE | Disposition: A | Discharge status: Home / Self Care | Payer: Self-pay | Source: Home / Self Care | Attending: Internal Medicine

## 2022-03-03 ENCOUNTER — Ambulatory Visit (HOSPITAL_COMMUNITY)
Admission: RE | Admit: 2022-03-03 | Discharge: 2022-03-03 | Disposition: A | Discharge status: Home / Self Care | Attending: Internal Medicine | Admitting: Internal Medicine

## 2022-03-03 ENCOUNTER — Ambulatory Visit (HOSPITAL_COMMUNITY): Admitting: Certified Registered"

## 2022-03-03 ENCOUNTER — Telehealth: Payer: Self-pay | Admitting: *Deleted

## 2022-03-03 ENCOUNTER — Encounter (HOSPITAL_COMMUNITY): Payer: Self-pay | Admitting: Internal Medicine

## 2022-03-03 DIAGNOSIS — K5730 Diverticulosis of large intestine without perforation or abscess without bleeding: Secondary | ICD-10-CM

## 2022-03-03 DIAGNOSIS — K219 Gastro-esophageal reflux disease without esophagitis: Secondary | ICD-10-CM | POA: Diagnosis not present

## 2022-03-03 DIAGNOSIS — Z1211 Encounter for screening for malignant neoplasm of colon: Secondary | ICD-10-CM | POA: Insufficient documentation

## 2022-03-03 DIAGNOSIS — Z8719 Personal history of other diseases of the digestive system: Secondary | ICD-10-CM | POA: Insufficient documentation

## 2022-03-03 DIAGNOSIS — K2270 Barrett's esophagus without dysplasia: Secondary | ICD-10-CM

## 2022-03-03 DIAGNOSIS — R7989 Other specified abnormal findings of blood chemistry: Secondary | ICD-10-CM

## 2022-03-03 DIAGNOSIS — F32A Depression, unspecified: Secondary | ICD-10-CM | POA: Insufficient documentation

## 2022-03-03 DIAGNOSIS — I10 Essential (primary) hypertension: Secondary | ICD-10-CM | POA: Insufficient documentation

## 2022-03-03 DIAGNOSIS — G4733 Obstructive sleep apnea (adult) (pediatric): Secondary | ICD-10-CM | POA: Insufficient documentation

## 2022-03-03 DIAGNOSIS — K449 Diaphragmatic hernia without obstruction or gangrene: Secondary | ICD-10-CM

## 2022-03-03 DIAGNOSIS — Z87891 Personal history of nicotine dependence: Secondary | ICD-10-CM | POA: Diagnosis not present

## 2022-03-03 DIAGNOSIS — E119 Type 2 diabetes mellitus without complications: Secondary | ICD-10-CM

## 2022-03-03 DIAGNOSIS — Z09 Encounter for follow-up examination after completed treatment for conditions other than malignant neoplasm: Secondary | ICD-10-CM | POA: Insufficient documentation

## 2022-03-03 DIAGNOSIS — K209 Esophagitis, unspecified without bleeding: Secondary | ICD-10-CM | POA: Insufficient documentation

## 2022-03-03 HISTORY — PX: BIOPSY: SHX5522

## 2022-03-03 HISTORY — PX: COLONOSCOPY WITH PROPOFOL: SHX5780

## 2022-03-03 HISTORY — PX: ESOPHAGOGASTRODUODENOSCOPY (EGD) WITH PROPOFOL: SHX5813

## 2022-03-03 LAB — GLUCOSE, CAPILLARY: Glucose-Capillary: 195 mg/dL — ABNORMAL HIGH (ref 70–99)

## 2022-03-03 SURGERY — COLONOSCOPY WITH PROPOFOL
Anesthesia: General

## 2022-03-03 MED ORDER — PROPOFOL 1000 MG/100ML IV EMUL
INTRAVENOUS | Status: AC
  Filled 2022-03-03: qty 100

## 2022-03-03 MED ORDER — GLYCOPYRROLATE PF 0.2 MG/ML IJ SOSY
PREFILLED_SYRINGE | INTRAMUSCULAR | Status: AC
  Filled 2022-03-03: qty 1

## 2022-03-03 MED ORDER — PROPOFOL 10 MG/ML IV BOLUS
INTRAVENOUS | Status: DC | PRN
  Administered 2022-03-03: 150 ug/kg/min via INTRAVENOUS
  Administered 2022-03-03: 100 mg via INTRAVENOUS

## 2022-03-03 MED ORDER — LIDOCAINE HCL (PF) 2 % IJ SOLN
INTRAMUSCULAR | Status: AC
  Filled 2022-03-03: qty 5

## 2022-03-03 MED ORDER — GLYCOPYRROLATE 0.2 MG/ML IJ SOLN
INTRAMUSCULAR | Status: DC | PRN
  Administered 2022-03-03: .1 mg via INTRAVENOUS

## 2022-03-03 MED ORDER — LIDOCAINE HCL (CARDIAC) PF 100 MG/5ML IV SOSY
PREFILLED_SYRINGE | INTRAVENOUS | Status: DC | PRN
  Administered 2022-03-03: 50 mg via INTRAVENOUS

## 2022-03-03 MED ORDER — LACTATED RINGERS IV SOLN: INTRAVENOUS | Status: DC

## 2022-03-03 MED ORDER — STERILE WATER FOR IRRIGATION IR SOLN
Status: DC | PRN
  Administered 2022-03-03: 60 mL

## 2022-03-03 NOTE — Anesthesia Preprocedure Evaluation (Addendum)
Anesthesia Evaluation  Patient identified by MRN, date of birth, ID band Patient awake    Reviewed: Allergy & Precautions, NPO status , Patient's Chart, lab work & pertinent test results  Airway Mallampati: II  TM Distance: >3 FB Neck ROM: Full    Dental no notable dental hx.    Pulmonary sleep apnea , former smoker,    Pulmonary exam normal        Cardiovascular Exercise Tolerance: Good METS: 3 - Mets hypertension, negative cardio ROS Normal cardiovascular exam     Neuro/Psych Depression negative neurological ROS     GI/Hepatic Neg liver ROS, GERD  ,  Endo/Other  negative endocrine ROSdiabetes  Renal/GU negative Renal ROS     Musculoskeletal negative musculoskeletal ROS (+)   Abdominal (+) + obese,   Peds  Hematology negative hematology ROS (+)   Anesthesia Other Findings   Reproductive/Obstetrics                             Anesthesia Physical Anesthesia Plan  ASA: 3  Anesthesia Plan: General   Post-op Pain Management:    Induction: Intravenous  PONV Risk Score and Plan: 1 and Propofol infusion and TIVA  Airway Management Planned: Nasal Cannula and Natural Airway  Additional Equipment:   Intra-op Plan:   Post-operative Plan:   Informed Consent: I have reviewed the patients History and Physical, chart, labs and discussed the procedure including the risks, benefits and alternatives for the proposed anesthesia with the patient or authorized representative who has indicated his/her understanding and acceptance.     Dental advisory given  Plan Discussed with: CRNA  Anesthesia Plan Comments:        Anesthesia Quick Evaluation

## 2022-03-03 NOTE — H&P (Addendum)
_0 @   Primary Care Physician:  Celene Squibb, MD Primary Gastroenterologist:  Dr. Gala Romney  Pre-Procedure History & Physical: HPI:  Derek Marsh is a 61 y.o. male here for surveillance EGD.  History of short segment Barrett's esophagus.  Also here for average risk screening colonoscopy.  Blood sugars noted to be fairly poorly controlled recently.  No dysphagia or bowel symptoms. GERD well-controlled on twice daily Aciphex.  Past Medical History:  Diagnosis Date   Barrett's esophagus    Depression    Essential hypertension    GERD (gastroesophageal reflux disease)    Hyperlipidemia    Low testosterone    Obesity    OSA (obstructive sleep apnea)    uses CPAP at 13   Type 2 diabetes mellitus ([place])    Ventral hernia     Past Surgical History:  Procedure Laterality Date   BIOPSY  05/22/2018   Procedure: BIOPSY;  Surgeon: Daneil Dolin, MD;  Location: [facility];  Service: Endoscopy;;  distal,esophagus   COLONOSCOPY  11/13/2011   Dr. Gala Romney: diverticulosis, hemorrhoids. next TCS 10/2021   ESOPHAGOGASTRODUODENOSCOPY     Dr. Collene Mares: says he had GERD   ESOPHAGOGASTRODUODENOSCOPY N/A 05/22/2018   Dr. Gala Romney: Small hiatal hernia, Barrett's esophagus without dysplasia.  Next EGD in 3 years.   LEFT HEART CATH AND CORONARY ANGIOGRAPHY N/A 02/20/2020   Procedure: LEFT HEART CATH AND CORONARY ANGIOGRAPHY;  Surgeon: Nelva Bush, MD;  Location: [facility];  Service: Cardiovascular;  Laterality: N/A;    Prior to Admission medications   Medication Sig Start Date End Date Taking? Authorizing Provider  aspirin EC 81 MG tablet Take 1 tablet (81 mg total) by mouth daily. Swallow whole. 02/19/20  Yes Satira Sark, MD  b complex vitamins tablet Take 1 tablet by mouth daily.   Yes [provider]  buPROPion (WELLBUTRIN XL) 150 MG 24 hr tablet Take 150 mg by mouth every morning. 08/16/20  Yes [provider]  celecoxib (CELEBREX) 100 MG capsule Take 100 mg by mouth 2  (two) times daily.   Yes [provider]  cetirizine (ZYRTEC) 10 MG tablet Take 10 mg by mouth at bedtime.    Yes [provider]  desvenlafaxine (PRISTIQ) 50 MG 24 hr tablet Take 50 mg by mouth every evening.    Yes [provider]  GLUCOS-CHONDROIT-MSM-C-HYAL PO Take 2 tablets by mouth at bedtime.   Yes [provider]  mometasone (NASONEX) 50 MCG/ACT nasal spray Place 2 sprays into the nose at bedtime.    Yes [provider]  Multiple Vitamin (MULITIVITAMIN WITH MINERALS) TABS Take 1 tablet by mouth at bedtime.    Yes [provider]  Omega-3 Fatty Acids (FISH OIL PO) Take 2,000 mg by mouth every evening.    Yes [provider]  OVER THE COUNTER MEDICATION Take 100 mg by mouth 2 (two) times daily. CBD   Yes [provider]  Probiotic Product (PROBIOTIC DAILY PO) Take 1 capsule by mouth at bedtime. 15 Billion   Yes [provider]  RABEprazole (ACIPHEX) 20 MG tablet Take 20 mg by mouth in the morning and at bedtime.  11/28/19  Yes [provider]  rosuvastatin (CRESTOR) 20 MG tablet Take 20 mg by mouth at bedtime. 12/03/21  Yes [provider]  Semaglutide 14 MG TABS Take 14 mg by mouth in the morning. 12/31/19  Yes [provider]  SYNJARDY 12.5-500 MG TABS Take 1 tablet by mouth in the  morning and at bedtime. 02/23/20  Yes End, Harrell Gave, MD  tadalafil (CIALIS) 20 MG tablet Take 20 mg by mouth daily as needed for erectile dysfunction.   Yes [provider]  Testosterone 10 MG/ACT (2%) GEL Apply 4 mg topically daily. Pump 04/23/18  Yes [provider]  TURMERIC PO Take 1 tablet by mouth daily.   Yes [provider]  valsartan-hydrochlorothiazide (DIOVAN-HCT) 80-12.5 MG per tablet Take 1 tablet by mouth at bedtime.    Yes [provider]  Sod Picosulfate-Mag Ox-Cit Acd (CLENPIQ) 10-3.5-12 MG-GM -GM/175ML SOLN Take 1 kit by mouth as directed. 01/26/22   Marcos Peloso,  Cristopher Estimable, MD  tiZANidine (ZANAFLEX) 2 MG tablet Take 1-2 tablets (2-4 mg total) by mouth every 8 (eight) hours as needed for muscle spasms. Patient not taking: Reported on 02/22/2022 03/22/21   Scot Jun, FNP    Allergies as of 01/26/2022   (No Known Allergies)    Family History  Problem Relation Age of Onset   Diabetes Other    Other Daughter        had subtotal colectomy for severe constipation   Colon cancer Neg Hx    Liver disease Neg Hx     Social History   Socioeconomic History   Marital status: Married    Spouse name: Candace    Number of children: Not on file   Years of education: Not on file   Highest education level: Bachelor's degree (e.g., BA, AB, BS)  Occupational History   Occupation: Tour manager  Tobacco Use   Smoking status: Former    Packs/day: 2.00    Years: 15.00    Total pack years: 30.00    Types: Cigarettes    Start date: 08/24/1977    Quit date: 10/20/2006    Years since quitting: 15.3   Smokeless tobacco: Never  Substance and Sexual Activity   Alcohol use: Yes    Alcohol/week: 0.0 standard drinks of alcohol    Comment: rare   Drug use: Never   Sexual activity: Yes  Other Topics Concern   Not on file  Social History Narrative   2 cups of coffee per day   BS degree   Social Determinants of Health   Financial Resource Strain: Not on file  Food Insecurity: Not on file  Transportation Needs: Not on file  Physical Activity: Not on file  Stress: Not on file  Social Connections: Not on file  Intimate Partner Violence: Not on file    Review of Systems: See HPI, otherwise negative ROS  Physical Exam: BP 116/80   Pulse 78   Temp 97.8 F (36.6 C) (Oral)   Resp 15   Ht _0  (1.778 m)   Wt 132.8 kg   SpO2 94%   BMI 42.01 kg/m  General:   Alert,  Well-developed, well-nourished, pleasant and cooperative in NAD Neck:  Supple; no masses or thyromegaly. No significant cervical adenopathy. Lungs:  Clear throughout to  auscultation.   No wheezes, crackles, or rhonchi. No acute distress. Heart:  Regular rate and rhythm; no murmurs, clicks, rubs,  or gallops. Abdomen: Non-distended, normal bowel sounds.  Soft and nontender without appreciable mass or hepatosplenomegaly.  Pulses:  Normal pulses noted. Extremities:  Without clubbing or edema.  Impression/Plan: 61 year old gentleman with history of short segment Barrett's esophagus/GERD here for surveillance EGD.  Also,here for average risk screening colonoscopy. The risks, benefits, limitations, imponderables and alternatives regarding both EGD and colonoscopy have been reviewed with the patient. Questions  have been answered. All parties agreeable.       Notice: This dictation was prepared with Dragon dictation along with smaller phrase technology. Any transcriptional errors that result from this process are unintentional and may not be corrected upon review.

## 2022-03-03 NOTE — Op Note (Signed)
[facility] Patient Name: Derek Marsh Procedure Date: 03/03/2022 7:09 AM MRN: [id number] Date of Birth: [birth year] Attending MD: Gennette Pac , MD CSN: [id number] Age: 61 Admit Type: Outpatient Procedure:                Upper GI endoscopy Indications:              Surveillance for malignancy due to personal history                            of Barrett's esophagus Providers:                Gennette Pac, MD, Angelica Ran, Pandora Leiter, Technician Referring MD:              Medicines:                Propofol per Anesthesia Complications:            No immediate complications. Estimated Blood Loss:     Estimated blood loss was minimal. Procedure:                Pre-Anesthesia Assessment:                           - Prior to the procedure, a History and Physical                            was performed, and patient medications and                            allergies were reviewed. The patient's tolerance of                            previous anesthesia was also reviewed. The risks                            and benefits of the procedure and the sedation                            options and risks were discussed with the patient.                            All questions were answered, and informed consent                            was obtained. Prior Anticoagulants: The patient has                            taken no previous anticoagulant or antiplatelet                            agents. ASA Grade Assessment: II - A patient with  mild systemic disease. After reviewing the risks                            and benefits, the patient was deemed in                            satisfactory condition to undergo the procedure.                           After obtaining informed consent, the endoscope was                            passed under direct vision. Throughout the                            procedure, the  patient's blood pressure, pulse, and                            oxygen saturations were monitored continuously. The                            [id number]-H190 ([id number] scope was introduced through the                            mouth, and advanced to the second part of duodenum.                            The upper GI endoscopy was accomplished without                            difficulty. The patient tolerated the procedure                            well. Scope In: 7:40:46 AM Scope Out: 7:48:06 AM Total Procedure Duration: 0 hours 7 minutes 20 seconds  Findings:      (2) adjacent skinny tongues of salmon-colored epithelium coming up 1.5       cm from the GE junction. No esophagitis. No tumor. Tubular esophagus       patent throughout its course.      A small hiatal hernia was present. Normal-appearing gastric mucosa.       Patent pylorus.      The duodenal bulb and second portion of the duodenum were normal.       Biopsies of the abnormal distal esophagus taken for histologic study. Impression:               - Abnormal distal esophagus. Status post biopsy.                            Small hiatal hernia. Normal-appearing gastric                            mucosa- Normal duodenal bulb and second portion of  the duodenum. Moderate Sedation:      Moderate (conscious) sedation was personally administered by an       anesthesia professional. The following parameters were monitored: oxygen       saturation, heart rate, blood pressure, respiratory rate, EKG, adequacy       of pulmonary ventilation, and response to care. Recommendation:           - Patient has a contact number available for                            emergencies. The signs and symptoms of potential                            delayed complications were discussed with the                            patient. Return to normal activities tomorrow.                            Written discharge instructions were  provided to the                            patient.                           - Resume previous diet.                           - Continue present medications. Follow-up on                            pathology.                           - Return to my office in 7 months. Procedure Code(s):        --- Professional ---                           [phone], Esophagogastroduodenoscopy, flexible,                            transoral; diagnostic, including collection of                            specimen(s) by brushing or washing, when performed                            (separate procedure) Diagnosis Code(s):        --- Professional ---                           K44.9, Diaphragmatic hernia without obstruction or                            gangrene                           K22.70,  Barrett's esophagus without dysplasia CPT copyright 2019 [employer]. All rights reserved. The codes documented in this report are preliminary and upon coder review may  be revised to meet current compliance requirements. Gerrit Friends. Antoinetta Berrones, MD Gennette Pac, MD 03/03/2022 8:17:33 AM This report has been signed electronically. Number of Addenda: 0

## 2022-03-03 NOTE — Transfer of Care (Signed)
Immediate Anesthesia Transfer of Care Note  Patient: Derek Marsh  Procedure(s) Performed: COLONOSCOPY WITH PROPOFOL ESOPHAGOGASTRODUODENOSCOPY (EGD) WITH PROPOFOL BIOPSY  Patient Location: PACU and Short Stay  Anesthesia Type:MAC  Level of Consciousness: awake  Airway & Oxygen Therapy: Patient Spontanous Breathing  Post-op Assessment: Report given to RN  Post vital signs: Reviewed  Last Vitals:  Vitals Value Taken Time  BP    Temp    Pulse 66 03/03/22 0816  Resp 14 03/03/22 0816  SpO2 94 % 03/03/22 0816    Last Pain:  Vitals:   03/03/22 0733  TempSrc:   PainSc: 0-No pain         Complications: There were no known notable events for this encounter.

## 2022-03-03 NOTE — Discharge Instructions (Signed)
Colonoscopy Discharge Instructions  Read the instructions outlined below and refer to this sheet in the next few weeks. These discharge instructions provide you with general information on caring for yourself after you leave the hospital. Your doctor may also give you specific instructions. While your treatment has been planned according to the most current medical practices available, unavoidable complications occasionally occur. If you have any problems or questions after discharge, call Dr. Jena Gauss at [phone]. ACTIVITY You may resume your regular activity, but move at a slower pace for the next 24 hours.  Take frequent rest periods for the next 24 hours.  Walking will help get rid of the air and reduce the bloated feeling in your belly (abdomen).  No driving for 24 hours (because of the medicine (anesthesia) used during the test).   Do not sign any important legal documents or operate any machinery for 24 hours (because of the anesthesia used during the test).  NUTRITION Drink plenty of fluids.  You may resume your normal diet as instructed by your doctor.  Begin with a light meal and progress to your normal diet. Heavy or fried foods are harder to digest and may make you feel sick to your stomach (nauseated).  Avoid alcoholic beverages for 24 hours or as instructed.  MEDICATIONS You may resume your normal medications unless your doctor tells you otherwise.  WHAT YOU CAN EXPECT TODAY Some feelings of bloating in the abdomen.  Passage of more gas than usual.  Spotting of blood in your stool or on the toilet paper.  IF YOU HAD POLYPS REMOVED DURING THE COLONOSCOPY: No aspirin products for 7 days or as instructed.  No alcohol for 7 days or as instructed.  Eat a soft diet for the next 24 hours.  FINDING OUT THE RESULTS OF YOUR TEST Not all test results are available during your visit. If your test results are not back during the visit, make an appointment with your caregiver to find out the  results. Do not assume everything is normal if you have not heard from your caregiver or the medical facility. It is important for you to follow up on all of your test results.  SEEK IMMEDIATE MEDICAL ATTENTION IF: You have more than a spotting of blood in your stool.  Your belly is swollen (abdominal distention).  You are nauseated or vomiting.  You have a temperature over 101.  You have abdominal pain or discomfort that is severe or gets worse throughout the day.  EGD Discharge instructions Please read the instructions outlined below and refer to this sheet in the next few weeks. These discharge instructions provide you with general information on caring for yourself after you leave the hospital. Your doctor may also give you specific instructions. While your treatment has been planned according to the most current medical practices available, unavoidable complications occasionally occur. If you have any problems or questions after discharge, please call your doctor. ACTIVITY You may resume your regular activity but move at a slower pace for the next 24 hours.  Take frequent rest periods for the next 24 hours.  Walking will help expel (get rid of) the air and reduce the bloated feeling in your abdomen.  No driving for 24 hours (because of the anesthesia (medicine) used during the test).  You may shower.  Do not sign any important legal documents or operate any machinery for 24 hours (because of the anesthesia used during the test).  NUTRITION Drink plenty of fluids.  You may resume  your normal diet.  Begin with a light meal and progress to your normal diet.  Avoid alcoholic beverages for 24 hours or as instructed by your caregiver.  MEDICATIONS You may resume your normal medications unless your caregiver tells you otherwise.  WHAT YOU CAN EXPECT TODAY You may experience abdominal discomfort such as a feeling of fullness or "gas" pains.  FOLLOW-UP Your doctor will discuss the results of  your test with you.  SEEK IMMEDIATE MEDICAL ATTENTION IF ANY OF THE FOLLOWING OCCUR: Excessive nausea (feeling sick to your stomach) and/or vomiting.  Severe abdominal pain and distention (swelling).  Trouble swallowing.  Temperature over 101 F (37.8 C).  Rectal bleeding or vomiting of blood.    Your esophagus appeared unchanged.  Biopsies taken.    Diverticulosis found in your colon.  Your prep was not very good today.    I would recommend you return in 1 year for repeat colonoscopy with a better preparation.    Keep upcoming office visit in about 7 months as scheduled   see primary care physician about better blood sugar control   at patient request,  I called Araceli Bouche at [phone] findings and recommendations

## 2022-03-03 NOTE — Anesthesia Postprocedure Evaluation (Signed)
Anesthesia Post Note  Patient: Derek Marsh  Procedure(s) Performed: COLONOSCOPY WITH PROPOFOL ESOPHAGOGASTRODUODENOSCOPY (EGD) WITH PROPOFOL BIOPSY  Patient location during evaluation: Phase II Anesthesia Type: MAC Level of consciousness: awake and alert Pain management: pain level controlled Vital Signs Assessment: post-procedure vital signs reviewed and stable Respiratory status: spontaneous breathing, nonlabored ventilation, respiratory function stable and patient connected to nasal cannula oxygen Cardiovascular status: blood pressure returned to baseline and stable Postop Assessment: no apparent nausea or vomiting Anesthetic complications: no   There were no known notable events for this encounter.   Last Vitals:  Vitals:   03/03/22 0816 03/03/22 0821  BP: 113/76   Pulse: 66   Resp: 14   Temp:    SpO2: 94% 97%    Last Pain:  Vitals:   03/03/22 0816  TempSrc:   PainSc: 0-No pain                 Trixie Rude

## 2022-03-03 NOTE — Telephone Encounter (Signed)
-----   Message from Corbin Ade, MD sent at 03/03/2022  8:26 AM EDT -----  patient's prep today was not very good.  Please make a note in the chart when he comes around for his next colonoscopy.  He probably needs an extra liter or so a prep and another half a day of clear liquids.  Thanks.

## 2022-03-03 NOTE — Op Note (Signed)
[facility] Patient Name: Derek Marsh Procedure Date: 03/03/2022 7:08 AM MRN: [id number] Date of Birth: 1960-12-06 Attending MD: Gennette Pac , MD CSN: [id number] Age: 61 Admit Type: Outpatient Procedure:                Colonoscopy Indications:              Screening for colorectal malignant neoplasm Providers:                Gennette Pac, MD, Angelica Ran, Pandora Leiter, Technician Referring MD:              Medicines:                Propofol per Anesthesia Complications:            No immediate complications. Estimated Blood Loss:     Estimated blood loss: none. Procedure:                Pre-Anesthesia Assessment:                           - Prior to the procedure, a History and Physical                            was performed, and patient medications and                            allergies were reviewed. The patient's tolerance of                            previous anesthesia was also reviewed. The risks                            and benefits of the procedure and the sedation                            options and risks were discussed with the patient.                            All questions were answered, and informed consent                            was obtained. Prior Anticoagulants: The patient has                            taken no previous anticoagulant or antiplatelet                            agents. ASA Grade Assessment: III - A patient with                            severe systemic disease. After reviewing the risks  and benefits, the patient was deemed in                            satisfactory condition to undergo the procedure.                           After obtaining informed consent, the colonoscope                            was passed under direct vision. Throughout the                            procedure, the patient's blood pressure, pulse, and                            oxygen  saturations were monitored continuously. The                            [id number]) scope was introduced through the                            anus and advanced to the the cecum, identified by                            appendiceal orifice and ileocecal valve. The                            colonoscopy was performed without difficulty. The                            patient tolerated the procedure well. The quality                            of the bowel preparation was inadequate. Scope In: 7:54:41 AM Scope Out: 8:10:01 AM Scope Withdrawal Time: 0 hours 8 minutes 52 seconds  Total Procedure Duration: 0 hours 15 minutes 20 seconds  Findings:      The perianal and digital rectal examinations were normal.      Scattered small and large-mouthed diverticula were found in the colon.      The exam was otherwise without abnormality on direct and retroflexion       views. Impression:               - Preparation of the colon was inadequate.                           - Diverticulosis.                           - The examination was otherwise normal on direct                            and retroflexion views.                           - No specimens collected. Fine  detail of all the                            mucosal surfaces could not be assessed due to the                            presence of pockets of tenacious thick and                            semiformed stool. Moderate Sedation:      Moderate (conscious) sedation was personally administered by an       anesthesia professional. The following parameters were monitored: oxygen       saturation, heart rate, blood pressure, respiratory rate, EKG, adequacy       of pulmonary ventilation, and response to care. Recommendation:           - Patient has a contact number available for                            emergencies. The signs and symptoms of potential                            delayed complications were discussed with the                             patient. Return to normal activities tomorrow.                            Written discharge instructions were provided to the                            patient.                           - Resume previous diet.                           - Continue present medications.                           - Repeat colonoscopy in 1 year for screening                            purposes.                           - Return to GI office in 7 months. Procedure Code(s):        --- Professional ---                           [phone], Colonoscopy, flexible; diagnostic, including                            collection of specimen(s) by brushing or washing,  when performed (separate procedure) Diagnosis Code(s):        --- Professional ---                           Z12.11, Encounter for screening for malignant                            neoplasm of colon                           K57.30, Diverticulosis of large intestine without                            perforation or abscess without bleeding CPT copyright 2019 American [employer] Association. All rights reserved. The codes documented in this report are preliminary and upon coder review may  be revised to meet current compliance requirements. Gerrit Friends. Albin Duckett, MD Gennette Pac, MD 03/03/2022 9:03:47 AM This report has been signed electronically. Number of Addenda: 0

## 2022-03-03 NOTE — Telephone Encounter (Signed)
FYI

## 2022-03-06 ENCOUNTER — Encounter: Payer: Self-pay | Admitting: Internal Medicine

## 2022-03-06 LAB — SURGICAL PATHOLOGY

## 2022-03-06 NOTE — Progress Notes (Signed)
error 

## 2022-03-07 NOTE — Addendum Note (Signed)
Addendum  created 03/07/22 1041 by Lorin Glass, CRNA   Clinical Note Signed, SmartForm saved

## 2022-03-07 NOTE — Telephone Encounter (Signed)
Derek Marsh, you probably haven't gotten to it yet, please NIC for colonoscopy in one year. Thanks!

## 2022-03-09 ENCOUNTER — Encounter (HOSPITAL_COMMUNITY): Payer: Self-pay | Admitting: Internal Medicine

## 2022-03-14 ENCOUNTER — Other Ambulatory Visit: Payer: Self-pay

## 2022-03-14 DIAGNOSIS — R7989 Other specified abnormal findings of blood chemistry: Secondary | ICD-10-CM

## 2022-10-30 ENCOUNTER — Other Ambulatory Visit: Payer: Self-pay

## 2022-10-30 DIAGNOSIS — R7989 Other specified abnormal findings of blood chemistry: Secondary | ICD-10-CM

## 2022-11-20 ENCOUNTER — Telehealth: Payer: Self-pay | Admitting: Gastroenterology

## 2022-11-20 ENCOUNTER — Ambulatory Visit: Admitting: Gastroenterology

## 2022-11-20 ENCOUNTER — Encounter: Payer: Self-pay | Admitting: Gastroenterology

## 2022-11-20 VITALS — BP 113/72 | HR 80 | Temp 98.2°F | Ht 70.0 in | Wt 288.8 lb

## 2022-11-20 DIAGNOSIS — R7989 Other specified abnormal findings of blood chemistry: Secondary | ICD-10-CM

## 2022-11-20 DIAGNOSIS — K219 Gastro-esophageal reflux disease without esophagitis: Secondary | ICD-10-CM | POA: Diagnosis not present

## 2022-11-20 DIAGNOSIS — Z1211 Encounter for screening for malignant neoplasm of colon: Secondary | ICD-10-CM

## 2022-11-20 DIAGNOSIS — K2270 Barrett's esophagus without dysplasia: Secondary | ICD-10-CM

## 2022-11-20 DIAGNOSIS — K760 Fatty (change of) liver, not elsewhere classified: Secondary | ICD-10-CM | POA: Diagnosis not present

## 2022-11-20 NOTE — Patient Instructions (Signed)
We are scheduling you for a colonoscopy in the near future with Dr. Abbey Chatters.  We will reach out in July to get you scheduled for August per your request.  After you have your lab results in a few weeks with Dr. Juel Burrow office please send over those to our office.  Continue Aciphex 20 mg twice daily  Follow a GERD diet:  Avoid fried, fatty, greasy, spicy, citrus foods. Avoid caffeine and carbonated beverages. Avoid chocolate. Try eating 4-6 small meals a day rather than 3 large meals. Do not eat within 3 hours of laying down. Prop head of bed up on wood or bricks to create a 6 inch incline.  We will see him in 6 months, sooner if needed.  It was a pleasure to see you today! I want to create trusting relationships with patients. If you receive a survey regarding your visit,  I greatly appreciate you taking time to fill this out on paper or through your MyChart. I value your feedback.  Venetia Night, MSN, FNP-BC, AGACNP-BC [facility]

## 2022-11-20 NOTE — Progress Notes (Signed)
GI Office Note    Referring Provider: Celene Squibb, MD Primary Care Physician:  Celene Squibb, MD Primary Gastroenterologist: Cristopher Estimable.Rourk, MD   Date:  11/20/2022  ID:  Derek Marsh, DOB 05-Jun-1961, MRN [id number]   Chief Complaint   Chief Complaint  Patient presents with   Follow-up    Follow up on abd LFT's   History of Present Illness  Derek Marsh is a 62 y.o. male with a history of GERD, Barrett's esophagus, depression, HTN, HLD, type 2 diabetes, sleep apnea presenting today for follow up.   Colonoscopy 2013: Suboptimal prep, external hemorrhoids.  Normal rectum.  Pancolonic diverticulosis.  Repeat in 10 years.  EGD October 2019: -Salmon-colored mucosa in the esophagus s/p biopsy -Small hiatal hernia -Normal duodenum -Continue Dexilant 60 mg daily -HIDA with CCK to further evaluate dyspepsia -Repeat EGD in 3 years  HIDA December 2019: Normal gallbladder ejection fraction.  Patent CBD.  Last office visit 12/21/2021 to schedule EGD and colonoscopy given history of GERD and Barrett's esophagus as well as fatty liver.  Denies any melena, BRBPR, constipation, diarrhea, abdominal pain.  Taking Aciphex 20 mg twice daily.  Denied any dysphagia or vomiting.  Weight fluctuating due to stress of his wife having Alzheimer's.  Prior lab workup with negative for autoimmune serologies, hepatitis B, and hepatitis C.  Normal ferritin.  EGD July 2023: -Abnormal distal esophagus s/p biopsy -Small hiatal hernia -Normal stomach and duodenum -Continue current medications -Mild chronic inflammation, no evidence of Barrett's. -Repeat in 3 years.   Colonoscopy July 2023: -Inadequate prep of colon (presence of fungus of tenacious thick and semiformed stool) -Scattered small and large mouth diverticula found in the colon -Repeat colonoscopy in 1 year for surveillance  Reports he had some peanut M&Ms and had some lower abdominal pain but then it seemed to improve on its own. He also thinks  stress may have to do with that as well. Is under alot of stress tacking care of his wife gives she has early onset dementia. He has help with her at home.  Reflux well controlled on Aciphex 20 mg Bid. He likes the twice daily dosing better. Has failed protonix in the past and dexliant worked well but too expensive and feels as though Aciphex works better. No N/V, dysphagia.  States he has heard about catsclaw to help prevent diverticulitis. No melena, brbpr, constipation, diarrhea, abdominal pain, lack of appetite, unintentional weight loss.   Reports he had a small volume prep last time and that did not work as well as the gallon preps in the past.   Wt Readings from Last 3 Encounters:  11/20/22 288 lb 12.8 oz (131 kg)  03/03/22 292 lb 12.3 oz (132.8 kg)  03/01/22 292 lb 12.3 oz (132.8 kg)    Current Outpatient Medications  Medication Sig Dispense Refill   aspirin EC 81 MG tablet Take 1 tablet (81 mg total) by mouth daily. Swallow whole. 90 tablet 3   b complex vitamins tablet Take 1 tablet by mouth daily.     buPROPion (WELLBUTRIN XL) 150 MG 24 hr tablet Take 150 mg by mouth every morning.     celecoxib (CELEBREX) 100 MG capsule Take 100 mg by mouth 2 (two) times daily.     cetirizine (ZYRTEC) 10 MG tablet Take 10 mg by mouth at bedtime.      desvenlafaxine (PRISTIQ) 50 MG 24 hr tablet Take 50 mg by mouth every evening.      glipiZIDE (GLUCOTROL  XL) 10 MG 24 hr tablet Take 10 mg by mouth daily with breakfast.     GLUCOS-CHONDROIT-MSM-C-HYAL PO Take 2 tablets by mouth at bedtime.     mometasone (NASONEX) 50 MCG/ACT nasal spray Place 2 sprays into the nose at bedtime.      Multiple Vitamin (MULITIVITAMIN WITH MINERALS) TABS Take 1 tablet by mouth at bedtime.      Omega-3 Fatty Acids (FISH OIL PO) Take 2,000 mg by mouth every evening.      OVER THE COUNTER MEDICATION Take 100 mg by mouth 2 (two) times daily. CBD     Probiotic Product (PROBIOTIC DAILY PO) Take 1 capsule by mouth at bedtime.  50 Billion     RABEprazole (ACIPHEX) 20 MG tablet Take 20 mg by mouth in the morning and at bedtime.      rosuvastatin (CRESTOR) 20 MG tablet Take 20 mg by mouth at bedtime.     Semaglutide 14 MG TABS Take 14 mg by mouth in the morning.     SYNJARDY 12.5-500 MG TABS Take 1 tablet by mouth in the morning and at bedtime.     tadalafil (CIALIS) 20 MG tablet Take 20 mg by mouth daily as needed for erectile dysfunction.     Testosterone 10 MG/ACT (2%) GEL Apply 4 mg topically daily. Pump  0   tirzepatide (MOUNJARO) 7.5 MG/0.5ML Pen Inject 7.5 mg into the skin once a week.     tiZANidine (ZANAFLEX) 2 MG tablet Take 1-2 tablets (2-4 mg total) by mouth every 8 (eight) hours as needed for muscle spasms. 40 tablet 0   TURMERIC PO Take 1 tablet by mouth daily.     valsartan-hydrochlorothiazide (DIOVAN-HCT) 80-12.5 MG per tablet Take 1 tablet by mouth at bedtime.      Sod Picosulfate-Mag Ox-Cit Acd (CLENPIQ) 10-3.5-12 MG-GM -GM/175ML SOLN Take 1 kit by mouth as directed. (Patient not taking: Reported on 11/20/2022) 350 mL 0   Current Facility-Administered Medications  Medication Dose Route Frequency Provider Last Rate Last Admin   sodium chloride flush (NS) 0.9 % injection 3 mL  3 mL Intravenous Q12H Satira Sark, MD        Past Medical History:  Diagnosis Date   Barrett's esophagus    Depression    Essential hypertension    GERD (gastroesophageal reflux disease)    Hyperlipidemia    Low testosterone    Obesity    OSA (obstructive sleep apnea)    uses CPAP at 13   Type 2 diabetes mellitus    Ventral hernia     Past Surgical History:  Procedure Laterality Date   BIOPSY  05/22/2018   Procedure: BIOPSY;  Surgeon: Daneil Dolin, MD;  [facility];  Service: Endoscopy;;  distal,esophagus   BIOPSY  03/03/2022   Procedure: BIOPSY;  Surgeon: Daneil Dolin, MD;  [facility];  Service: Endoscopy;;   COLONOSCOPY  11/13/2011   Dr. Gala Romney: diverticulosis, hemorrhoids. next  TCS 10/2021   COLONOSCOPY WITH PROPOFOL N/A 03/03/2022   Procedure: COLONOSCOPY WITH PROPOFOL;  Surgeon: Daneil Dolin, MD;  Location: [facility];  Service: Endoscopy;  Laterality: N/A;  7:30am, asa 3   ESOPHAGOGASTRODUODENOSCOPY     Dr. Collene Mares: says he had GERD   ESOPHAGOGASTRODUODENOSCOPY N/A 05/22/2018   Dr. Gala Romney: Small hiatal hernia, Barrett's esophagus without dysplasia.  Next EGD in 3 years.   ESOPHAGOGASTRODUODENOSCOPY (EGD) WITH PROPOFOL N/A 03/03/2022   Procedure: ESOPHAGOGASTRODUODENOSCOPY (EGD) WITH PROPOFOL;  Surgeon: Daneil Dolin, MD;  Location: [facility];  Service: Endoscopy;  Laterality: N/A;   LEFT HEART CATH AND CORONARY ANGIOGRAPHY N/A 02/20/2020   Procedure: LEFT HEART CATH AND CORONARY ANGIOGRAPHY;  Surgeon: Nelva Bush, MD;  Location: [facility];  Service: Cardiovascular;  Laterality: N/A;    Family History  Problem Relation Age of Onset   Diabetes Other    Other Daughter        had subtotal colectomy for severe constipation   Colon cancer Neg Hx    Liver disease Neg Hx     Allergies as of 11/20/2022   (No Known Allergies)    Social History   Socioeconomic History   Marital status: Married    Spouse name: Candace    Number of children: Not on file   Years of education: Not on file   Highest education level: Bachelor's degree (e.g., BA, AB, BS)  Occupational History   Occupation: Tour manager  Tobacco Use   Smoking status: Former    Packs/day: 2.00    Years: 15.00    Additional pack years: 0.00    Total pack years: 30.00    Types: Cigarettes    Start date: 08/24/1977    Quit date: 10/20/2006    Years since quitting: 16.0   Smokeless tobacco: Never  Substance and Sexual Activity   Alcohol use: Yes    Alcohol/week: 0.0 standard drinks of alcohol    Comment: rare   Drug use: Never   Sexual activity: Yes  Other Topics Concern   Not on file  Social History Narrative   2 cups of coffee per day   BS degree   Social  Determinants of Health   Financial Resource Strain: Not on file  Food Insecurity: Not on file  Transportation Needs: Not on file  Physical Activity: Not on file  Stress: Not on file  Social Connections: Not on file     Review of Systems   Gen: Denies fever, chills, anorexia. Denies fatigue, weakness, weight loss.  CV: Denies chest pain, palpitations, syncope, peripheral edema, and claudication. Resp: Denies dyspnea at rest, cough, wheezing, coughing up blood, and pleurisy. GI: See HPI Derm: Denies rash, itching, dry skin Psych: Denies depression, anxiety, memory loss, confusion. No homicidal or suicidal ideation.  Heme: Denies bruising, bleeding, and enlarged lymph nodes.   Physical Exam   BP 113/72   Pulse 80   Temp 98.2 F (36.8 C)   Ht 5\' 10"  (1.778 m)   Wt 288 lb 12.8 oz (131 kg)   BMI 41.44 kg/m   General:   Alert and oriented. No distress noted. Pleasant and cooperative.  Head:  Normocephalic and atraumatic. Eyes:  Conjuctiva clear without scleral icterus. Mouth:  Oral mucosa pink and moist. Good dentition. No lesions. Lungs:  Clear to auscultation bilaterally. No wheezes, rales, or rhonchi. No distress.  Heart:  S1, S2 present without murmurs appreciated.  Abdomen:  +BS, soft, non-tender and non-distended. Rounded.No rebound or guarding. No HSM or masses noted. Rectal: deferred Msk:  Symmetrical without gross deformities. Normal posture. Extremities:  Without edema. Neurologic:  Alert and  oriented x4 Psych:  Alert and cooperative. Normal mood and affect.   Assessment  Derek Marsh is a 62 y.o. male with a history of GERD, Barrett's esophagus, depression, HTN, HLD, type 2 diabetes, sleep apnea presenting today for follow up.   Elevated LFTs: Lab work in July 2023 with AST 24, ALT 35, alk phos 133, T. bili 1 with indirect 0.8.  Evidence of diffuse hepatic steatosis on ultrasound in June 2019.  Prior workup without any elevation of ferritin, negative autoimmune  serologies, negative for hepatitis B and hepatitis C.  Due to have blood work in a little over 2 weeks his primary care office.  I have requested that he have the sent over once performed.  Barrett's esophagus, GERD: Evidence of Barrett's on EGD in 2019.  Most recent EGD in July 2023 with only chronic inflammation, no intestinal metaplasia or dysplasia identified.  Has a longstanding history of GERD, likely exacerbated by diet and weight.  Continues to work on weight loss as much as able however given life stressors this has been difficult.  He is currently maintained on Aciphex 20 mg twice daily and is doing well on this.  He prefers twice daily dosing.  Screening for colon cancer: Colonoscopy in July 2023 with inadequate prep, evidence of diverticulosis, unable to rule out any malignancy or evidence of polyps given prep.  Recommended repeat in 1 year..  Patient states he followed instructions however feels as though this low volume prep is not adequate enough for him.  He denies constipation.  We will augment his next prep with TriLyte and an extra half day of clears.  He prefers to wait till after vacation and therefore he would like to have his repeat in August.  I instructed him to call the office with any medication changes from now until time of procedure.  He is in agreement.  PLAN   Will have HFP in a couple weeks with Dr. Nevada Crane.  Proceed with colonoscopy with propofol by Dr. Abbey Chatters  in near future: the risks, benefits, and alternatives have been discussed with the patient in detail. The patient states understanding and desires to proceed.  ASA 3  (August 2024) Hold [facility] for 1 week Hold glipizide the morning of procedure.  Hold Synjardy for 3 days Extra half day of clears Trilyte prep Continue Aciphex 20 mg twice daily GERD diet/lifestyle modifications Follow up in 6 months    Venetia Night, MSN, FNP-BC, AGACNP-BC [facility]

## 2022-11-20 NOTE — Telephone Encounter (Signed)
Please NIC for follow up in 6 months.   Venetia Night, MSN, APRN, FNP-BC, AGACNP-BC [facility]

## 2023-01-11 ENCOUNTER — Encounter: Payer: Self-pay | Admitting: *Deleted

## 2023-02-15 ENCOUNTER — Other Ambulatory Visit (HOSPITAL_BASED_OUTPATIENT_CLINIC_OR_DEPARTMENT_OTHER): Payer: Self-pay

## 2023-02-15 ENCOUNTER — Other Ambulatory Visit (HOSPITAL_COMMUNITY): Payer: Self-pay

## 2023-02-15 MED ORDER — MOUNJARO 12.5 MG/0.5ML ~~LOC~~ SOAJ
12.5000 mg | SUBCUTANEOUS | 2 refills | Status: DC
  Filled 2023-02-15: qty 2, 28d supply, fill #0
  Filled 2023-02-28 – 2023-03-07 (×2): qty 2, 28d supply, fill #1

## 2023-03-01 ENCOUNTER — Other Ambulatory Visit (HOSPITAL_COMMUNITY): Payer: Self-pay

## 2023-03-06 ENCOUNTER — Telehealth: Payer: Self-pay | Admitting: *Deleted

## 2023-03-06 NOTE — Telephone Encounter (Signed)
LMOVM to call back to schedule TCS with Dr. Jena Gauss, ASA 3, hold mounjaro x 1 week, synjardy x 3 days, no glipizide day of, needs 1/2 day extra clears, trilyte prep

## 2023-03-07 ENCOUNTER — Other Ambulatory Visit: Payer: Self-pay

## 2023-03-08 ENCOUNTER — Encounter: Payer: Self-pay | Admitting: *Deleted

## 2023-03-08 ENCOUNTER — Other Ambulatory Visit: Payer: Self-pay | Admitting: *Deleted

## 2023-03-08 MED ORDER — PEG 3350-KCL-NA BICARB-NACL 420 G PO SOLR: 4000.0000 mL | Freq: Once | ORAL | 0 refills | Status: AC

## 2023-03-08 NOTE — Telephone Encounter (Signed)
Pt has been scheduled for 04/19/23. Instructions mailed. Prep sent to the pharmacy.

## 2023-03-08 NOTE — Telephone Encounter (Signed)
Correction: pt has been scheduled for 04/18/23.

## 2023-04-13 NOTE — Patient Instructions (Signed)
Derek Marsh  04/13/2023     @PREFPERIOPPHARMACY @   Your procedure is scheduled on  04/18/2023.   Report to [facility] at  1200 P.M.   Call this number if you have problems the morning of surgery:  [phone]  If you experience any cold or flu symptoms such as cough, fever, chills, shortness of breath, etc. between now and your scheduled surgery, please notify us at the above number.   Remember:  Follow the diet and prep instructions given to you by the office.     Your last dose of mounjaro should be on 04/10/2023 and   your last dose on synjardy should be on 04/14/2023.      Take these medicines the morning of surgery with A SIP OF WATER                         wellbutrin, aciphex, zanaflex(if needed).     Do not wear jewelry, make-up or nail polish, including gel polish,  artificial nails, or any other type of covering on natural nails (fingers and  toes).  Do not wear lotions, powders, or perfumes, or deodorant.  Do not shave 48 hours prior to surgery.  Men may shave face and neck.  Do not bring valuables to the hospital.  [facility] is not responsible for any belongings or valuables.  Contacts, dentures or bridgework may not be worn into surgery.  Leave your suitcase in the car.  After surgery it may be brought to your room.  For patients admitted to the hospital, discharge time will be determined by your treatment team.  Patients discharged the day of surgery will not be allowed to drive home and must have someone with them for 24 hours.    Special instructions:   DO NOT smoke tobacco or vape for 24 hours before your procedure.  Please read over the following fact sheets that you were given. Anesthesia Post-op Instructions and Care and Recovery After Surgery      Colonoscopy, Adult, Care After The following information offers guidance on how to care for yourself after your procedure. Your health care provider may also give you more specific  instructions. If you have problems or questions, contact your health care provider. What can I expect after the procedure? After the procedure, it is common to have: A small amount of blood in your stool for 24 hours after the procedure. Some gas. Mild cramping or bloating of your abdomen. Follow these instructions at home: Eating and drinking  Drink enough fluid to keep your urine pale yellow. Follow instructions from your health care provider about eating or drinking restrictions. Resume your normal diet as told by your health care provider. Avoid heavy or fried foods that are hard to digest. Activity Rest as told by your health care provider. Avoid sitting for a long time without moving. Get up to take short walks every 1-2 hours. This is important to improve blood flow and breathing. Ask for help if you feel weak or unsteady. Return to your normal activities as told by your health care provider. Ask your health care provider what activities are safe for you. Managing cramping and bloating  Try walking around when you have cramps or feel bloated. If directed, apply heat to your abdomen as told by your health care provider. Use the heat source that your health care provider recommends, such as a moist heat pack or a heating  pad. Place a towel between your skin and the heat source. Leave the heat on for 20-30 minutes. Remove the heat if your skin turns bright red. This is especially important if you are unable to feel pain, heat, or cold. You have a greater risk of getting burned. General instructions If you were given a sedative during the procedure, it can affect you for several hours. Do not drive or operate machinery until your health care provider says that it is safe. For the first 24 hours after the procedure: Do not sign important documents. Do not drink alcohol. Do your regular daily activities at a slower pace than normal. Eat soft foods that are easy to digest. Take  over-the-counter and prescription medicines only as told by your health care provider. Keep all follow-up visits. This is important. Contact a health care provider if: You have blood in your stool 2-3 days after the procedure. Get help right away if: You have more than a small spotting of blood in your stool. You have large blood clots in your stool. You have swelling of your abdomen. You have nausea or vomiting. You have a fever. You have increasing pain in your abdomen that is not relieved with medicine. These symptoms may be an emergency. Get help right away. Call 911. Do not wait to see if the symptoms will go away. Do not drive yourself to the hospital. Summary After the procedure, it is common to have a small amount of blood in your stool. You may also have mild cramping and bloating of your abdomen. If you were given a sedative during the procedure, it can affect you for several hours. Do not drive or operate machinery until your health care provider says that it is safe. Get help right away if you have a lot of blood in your stool, nausea or vomiting, a fever, or increased pain in your abdomen. This information is not intended to replace advice given to you by your health care provider. Make sure you discuss any questions you have with your health care provider. Document Revised: 09/19/2022 Document Reviewed: 03/30/2021 Elsevier Patient Education  2024 [employer] Monitored Anesthesia Care, Care After The following information offers guidance on how to care for yourself after your procedure. Your health care provider may also give you more specific instructions. If you have problems or questions, contact your health care provider. What can I expect after the procedure? After the procedure, it is common to have: Tiredness. Little or no memory about what happened during or after the procedure. Impaired judgment when it comes to making decisions. Nausea or vomiting. Some trouble  with balance. Follow these instructions at home: For the time period you were told by your health care provider:  Rest. Do not participate in activities where you could fall or become injured. Do not drive or use machinery. Do not drink alcohol. Do not take sleeping pills or medicines that cause drowsiness. Do not make important decisions or sign legal documents. Do not take care of children on your own. Medicines Take over-the-counter and prescription medicines only as told by your health care provider. If you were prescribed antibiotics, take them as told by your health care provider. Do not stop using the antibiotic even if you start to feel better. Eating and drinking Follow instructions from your health care provider about what you may eat and drink. Drink enough fluid to keep your urine pale yellow. If you vomit: Drink clear fluids slowly and in small amounts as  you are able. Clear fluids include water, ice chips, low-calorie sports drinks, and fruit juice that has water added to it (diluted fruit juice). Eat light and bland foods in small amounts as you are able. These foods include bananas, applesauce, rice, lean meats, toast, and crackers. General instructions  Have a responsible adult stay with you for the time you are told. It is important to have someone help care for you until you are awake and alert. If you have sleep apnea, surgery and some medicines can increase your risk for breathing problems. Follow instructions from your health care provider about wearing your sleep device: When you are sleeping. This includes during daytime naps. While taking prescription pain medicines, sleeping medicines, or medicines that make you drowsy. Do not use any products that contain nicotine or tobacco. These products include cigarettes, chewing tobacco, and vaping devices, such as e-cigarettes. If you need help quitting, ask your health care provider. Contact a health care provider  if: You feel nauseous or vomit every time you eat or drink. You feel light-headed. You are still sleepy or having trouble with balance after 24 hours. You get a rash. You have a fever. You have redness or swelling around the IV site. Get help right away if: You have trouble breathing. You have new confusion after you get home. These symptoms may be an emergency. Get help right away. Call 911. Do not wait to see if the symptoms will go away. Do not drive yourself to the hospital. This information is not intended to replace advice given to you by your health care provider. Make sure you discuss any questions you have with your health care provider. Document Revised: 01/02/2022 Document Reviewed: 01/02/2022 Elsevier Patient Education  2024 [employer].

## 2023-04-16 ENCOUNTER — Encounter (HOSPITAL_COMMUNITY): Payer: Self-pay

## 2023-04-16 ENCOUNTER — Encounter (HOSPITAL_COMMUNITY)
Admission: RE | Admit: 2023-04-16 | Discharge: 2023-04-16 | Disposition: A | Discharge status: Home / Self Care | Source: Ambulatory Visit | Attending: Internal Medicine | Admitting: Internal Medicine

## 2023-04-16 VITALS — BP 107/66 | HR 84 | Temp 97.7°F | Resp 18 | Ht 70.0 in | Wt 280.0 lb

## 2023-04-16 DIAGNOSIS — I10 Essential (primary) hypertension: Secondary | ICD-10-CM

## 2023-04-16 DIAGNOSIS — E119 Type 2 diabetes mellitus without complications: Secondary | ICD-10-CM | POA: Diagnosis present

## 2023-04-16 DIAGNOSIS — Z01818 Encounter for other preprocedural examination: Secondary | ICD-10-CM | POA: Diagnosis not present

## 2023-04-16 LAB — BASIC METABOLIC PANEL
Anion gap: 9 (ref 5–15)
BUN: 17 mg/dL (ref 8–23)
CO2: 23 mmol/L (ref 22–32)
Calcium: 9.1 mg/dL (ref 8.9–10.3)
Chloride: 103 mmol/L (ref 98–111)
Creatinine, Ser: 1 mg/dL (ref 0.61–1.24)
GFR, Estimated: >60 mL/min (ref >60)
Glucose, Bld: 72 mg/dL (ref 70–99)
Potassium: 3.7 mmol/L (ref 3.5–5.1)
Sodium: 135 mmol/L (ref 135–145)

## 2023-04-18 ENCOUNTER — Encounter (HOSPITAL_COMMUNITY): Payer: Self-pay | Admitting: Internal Medicine

## 2023-04-18 ENCOUNTER — Encounter (HOSPITAL_COMMUNITY)
Admission: RE | Disposition: A | Discharge status: Home / Self Care | Payer: Self-pay | Source: Home / Self Care | Attending: Internal Medicine

## 2023-04-18 ENCOUNTER — Ambulatory Visit (HOSPITAL_COMMUNITY): Admitting: Anesthesiology

## 2023-04-18 ENCOUNTER — Ambulatory Visit (HOSPITAL_COMMUNITY)
Admission: RE | Admit: 2023-04-18 | Discharge: 2023-04-18 | Disposition: A | Discharge status: Home / Self Care | Attending: Internal Medicine | Admitting: Internal Medicine

## 2023-04-18 ENCOUNTER — Ambulatory Visit (HOSPITAL_BASED_OUTPATIENT_CLINIC_OR_DEPARTMENT_OTHER): Admitting: Anesthesiology

## 2023-04-18 DIAGNOSIS — K5730 Diverticulosis of large intestine without perforation or abscess without bleeding: Secondary | ICD-10-CM

## 2023-04-18 DIAGNOSIS — Z7984 Long term (current) use of oral hypoglycemic drugs: Secondary | ICD-10-CM | POA: Insufficient documentation

## 2023-04-18 DIAGNOSIS — Z1211 Encounter for screening for malignant neoplasm of colon: Secondary | ICD-10-CM | POA: Insufficient documentation

## 2023-04-18 DIAGNOSIS — G4730 Sleep apnea, unspecified: Secondary | ICD-10-CM | POA: Diagnosis not present

## 2023-04-18 DIAGNOSIS — D123 Benign neoplasm of transverse colon: Secondary | ICD-10-CM | POA: Insufficient documentation

## 2023-04-18 DIAGNOSIS — D126 Benign neoplasm of colon, unspecified: Secondary | ICD-10-CM | POA: Diagnosis not present

## 2023-04-18 DIAGNOSIS — Z87891 Personal history of nicotine dependence: Secondary | ICD-10-CM | POA: Diagnosis not present

## 2023-04-18 DIAGNOSIS — E119 Type 2 diabetes mellitus without complications: Secondary | ICD-10-CM | POA: Diagnosis not present

## 2023-04-18 DIAGNOSIS — F32A Depression, unspecified: Secondary | ICD-10-CM | POA: Diagnosis not present

## 2023-04-18 DIAGNOSIS — I10 Essential (primary) hypertension: Secondary | ICD-10-CM | POA: Diagnosis not present

## 2023-04-18 DIAGNOSIS — K219 Gastro-esophageal reflux disease without esophagitis: Secondary | ICD-10-CM | POA: Insufficient documentation

## 2023-04-18 DIAGNOSIS — G4733 Obstructive sleep apnea (adult) (pediatric): Secondary | ICD-10-CM | POA: Insufficient documentation

## 2023-04-18 HISTORY — PX: POLYPECTOMY: SHX149

## 2023-04-18 HISTORY — PX: COLONOSCOPY WITH PROPOFOL: SHX5780

## 2023-04-18 LAB — GLUCOSE, CAPILLARY
Glucose-Capillary: 79 mg/dL (ref 70–99)
Glucose-Capillary: 91 mg/dL (ref 70–99)

## 2023-04-18 SURGERY — COLONOSCOPY WITH PROPOFOL
Anesthesia: General

## 2023-04-18 MED ORDER — PROPOFOL 10 MG/ML IV BOLUS
INTRAVENOUS | Status: DC | PRN
  Administered 2023-04-18: 50 mg via INTRAVENOUS
  Administered 2023-04-18: 100 mg via INTRAVENOUS

## 2023-04-18 MED ORDER — DEXTROSE 50 % IV SOLN
INTRAVENOUS | Status: AC
  Administered 2023-04-18: 12.5 g via INTRAVENOUS
  Filled 2023-04-18: qty 50

## 2023-04-18 MED ORDER — PROPOFOL 500 MG/50ML IV EMUL
INTRAVENOUS | Status: DC | PRN
  Administered 2023-04-18: 150 ug/kg/min via INTRAVENOUS

## 2023-04-18 MED ORDER — LACTATED RINGERS IV SOLN
INTRAVENOUS | Status: DC | PRN
Start: 2023-04-18 — End: 2023-04-18

## 2023-04-18 MED ORDER — DEXTROSE 50 % IV SOLN: 12.5000 g | Freq: Once | INTRAVENOUS | Status: AC

## 2023-04-18 MED ORDER — LIDOCAINE HCL (CARDIAC) PF 100 MG/5ML IV SOSY
PREFILLED_SYRINGE | INTRAVENOUS | Status: DC | PRN
  Administered 2023-04-18: 50 mg via INTRAVENOUS

## 2023-04-18 NOTE — Anesthesia Preprocedure Evaluation (Signed)
Anesthesia Evaluation  Patient identified by MRN, date of birth, ID band Patient awake    Reviewed: Allergy & Precautions, H&P , NPO status , Patient's Chart, lab work & pertinent test results, reviewed documented beta blocker date and time   Airway Mallampati: II  TM Distance: >3 FB Neck ROM: full    Dental no notable dental hx.    Pulmonary neg pulmonary ROS, sleep apnea , former smoker   Pulmonary exam normal breath sounds clear to auscultation       Cardiovascular Exercise Tolerance: Good hypertension, negative cardio ROS  Rhythm:regular Rate:Normal     Neuro/Psych  PSYCHIATRIC DISORDERS  Depression    negative neurological ROS  negative psych ROS   GI/Hepatic negative GI ROS, Neg liver ROS,GERD  ,,  Endo/Other  negative endocrine ROSdiabetes    Renal/GU negative Renal ROS  negative genitourinary   Musculoskeletal   Abdominal   Peds  Hematology negative hematology ROS (+)   Anesthesia Other Findings   Reproductive/Obstetrics negative OB ROS                             Anesthesia Physical Anesthesia Plan  ASA: 2  Anesthesia Plan: General   Post-op Pain Management:    Induction:   PONV Risk Score and Plan: Propofol infusion  Airway Management Planned:   Additional Equipment:   Intra-op Plan:   Post-operative Plan:   Informed Consent: I have reviewed the patients History and Physical, chart, labs and discussed the procedure including the risks, benefits and alternatives for the proposed anesthesia with the patient or authorized representative who has indicated his/her understanding and acceptance.     Dental Advisory Given  Plan Discussed with: CRNA  Anesthesia Plan Comments:        Anesthesia Quick Evaluation

## 2023-04-18 NOTE — Transfer of Care (Signed)
Immediate Anesthesia Transfer of Care Note  Patient: Derek Marsh  Procedure(s) Performed: COLONOSCOPY WITH PROPOFOL POLYPECTOMY INTESTINAL  Patient Location: Short Stay  Anesthesia Type:General  Level of Consciousness: drowsy  Airway & Oxygen Therapy: Patient Spontanous Breathing  Post-op Assessment: Report given to RN and Post -op Vital signs reviewed and stable  Post vital signs: Reviewed and stable  Last Vitals:  Vitals Value Taken Time  BP    Temp    Pulse    Resp    SpO2      Last Pain:  Vitals:   04/18/23 1352  TempSrc:   PainSc: 0-No pain      Patients Stated Pain Goal: 5 (04/18/23 1239)  Complications: No notable events documented.

## 2023-04-18 NOTE — Op Note (Signed)
[facility] Patient Name: Derek Marsh Procedure Date: 04/18/2023 1:39 PM MRN: [id number] Date of Birth: [birth year] Attending MD: Gennette Pac , MD, [id number] CSN: [id number] Age: 61 Admit Type: Outpatient Procedure:                Colonoscopy Indications:              Screening for colorectal malignant neoplasm Providers:                Gennette Pac, MD, Crystal Page, Pandora Leiter, Technician Referring MD:              Medicines:                Propofol per Anesthesia Complications:            No immediate complications. Estimated Blood Loss:     Estimated blood loss was minimal. Procedure:                Pre-Anesthesia Assessment:                           - Prior to the procedure, a History and Physical                            was performed, and patient medications and                            allergies were reviewed. The patient's tolerance of                            previous anesthesia was also reviewed. The risks                            and benefits of the procedure and the sedation                            options and risks were discussed with the patient.                            All questions were answered, and informed consent                            was obtained. Prior Anticoagulants: The patient has                            taken no anticoagulant or antiplatelet agents. ASA                            Grade Assessment: III - A patient with severe                            systemic disease. After reviewing the risks and  benefits, the patient was deemed in satisfactory                            condition to undergo the procedure.                           After obtaining informed consent, the colonoscope                            was passed under direct vision. Throughout the                            procedure, the patient's blood pressure, pulse, and                             oxygen saturations were monitored continuously. The                            [id number]) scope was introduced through the                            anus and advanced to the the cecum, identified by                            appendiceal orifice and ileocecal valve. The                            colonoscopy was performed without difficulty. The                            patient tolerated the procedure well. The quality                            of the bowel preparation was adequate. The                            ileocecal valve, appendiceal orifice, and rectum                            were photographed. The colonoscopy was performed                            without difficulty. The patient tolerated the                            procedure well. The quality of the bowel                            preparation was adequate. Scope In: 1:57:31 PM Scope Out: 2:15:42 PM Scope Withdrawal Time: 0 hours 8 minutes 23 seconds  Total Procedure Duration: 0 hours 18 minutes 11 seconds  Findings:      The perianal and digital rectal examinations were normal.      Scattered medium-mouthed diverticula were found in the entire colon.  A 7 mm polyp was found in the hepatic flexure. The polyp was       semi-pedunculated. The polyp was removed with a cold snare. Resection       and retrieval were complete. Estimated blood loss was minimal.      The exam was otherwise without abnormality on direct and retroflexion       views. Impression:               - Diverticulosis in the entire examined colon.                           - One 7 mm polyp at the hepatic flexure, removed                            with a cold snare. Resected and retrieved.                           - The examination was otherwise normal on direct                            and retroflexion views. Moderate Sedation:      Moderate (conscious) sedation was personally administered by an       anesthesia professional. The  following parameters were monitored: oxygen       saturation, heart rate, blood pressure, respiratory rate, EKG, adequacy       of pulmonary ventilation, and response to care. Recommendation:           - Patient has a contact number available for                            emergencies. The signs and symptoms of potential                            delayed complications were discussed with the                            patient. Return to normal activities tomorrow.                            Written discharge instructions were provided to the                            patient.                           - Advance diet as tolerated.                           - Continue present medications.                           - Repeat colonoscopy date to be determined after                            pending pathology results are reviewed for  surveillance.                           - Return to GI office (date not yet determined). Procedure Code(s):        --- Professional ---                           [phone], Colonoscopy, flexible; with removal of                            tumor(s), polyp(s), or other lesion(s) by snare                            technique Diagnosis Code(s):        --- Professional ---                           Z12.11, Encounter for screening for malignant                            neoplasm of colon                           D12.3, Benign neoplasm of transverse colon (hepatic                            flexure or splenic flexure)                           K57.30, Diverticulosis of large intestine without                            perforation or abscess without bleeding CPT copyright 2022 American [employer] Association. All rights reserved. The codes documented in this report are preliminary and upon coder review may  be revised to meet current compliance requirements. [name], MD Gennette Pac, MD 04/18/2023 2:24:18 PM This report has been  signed electronically. Number of Addenda: 0

## 2023-04-18 NOTE — H&P (Signed)
@LOGO @   Primary Care Physician:  Benita Stabile, MD Primary Gastroenterologist:  Dr. Jena Gauss  Pre-Procedure History & Physical: HPI:  Derek Marsh is a 62 y.o. male is here for a screening colonoscopy.   Negative colonoscopy 2013.  Here for average risk screening  Past Medical History:  Diagnosis Date   Barrett's esophagus    Depression    Essential hypertension    GERD (gastroesophageal reflux disease)    Hyperlipidemia    Low testosterone    Obesity    OSA (obstructive sleep apnea)    uses CPAP at 13   Type 2 diabetes mellitus (HCC)    Ventral hernia     Past Surgical History:  Procedure Laterality Date   BIOPSY  05/22/2018   Procedure: BIOPSY;  Surgeon: Corbin Ade, MD;  Location: [facility];  Service: Endoscopy;;  distal,esophagus   BIOPSY  03/03/2022   Procedure: BIOPSY;  Surgeon: Corbin Ade, MD;  Location: [facility];  Service: Endoscopy;;   COLONOSCOPY  11/13/2011   Dr. Jena Gauss: diverticulosis, hemorrhoids. next TCS 10/2021   COLONOSCOPY WITH PROPOFOL N/A 03/03/2022   Procedure: COLONOSCOPY WITH PROPOFOL;  Surgeon: Corbin Ade, MD;  Location: [facility];  Service: Endoscopy;  Laterality: N/A;  7:30am, asa 3   ESOPHAGOGASTRODUODENOSCOPY     Dr. Loreta Ave: says he had GERD   ESOPHAGOGASTRODUODENOSCOPY N/A 05/22/2018   Dr. Jena Gauss: Small hiatal hernia, Barrett's esophagus without dysplasia.  Next EGD in 3 years.   ESOPHAGOGASTRODUODENOSCOPY (EGD) WITH PROPOFOL N/A 03/03/2022   Procedure: ESOPHAGOGASTRODUODENOSCOPY (EGD) WITH PROPOFOL;  Surgeon: Corbin Ade, MD;  Location: [facility];  Service: Endoscopy;  Laterality: N/A;   LEFT HEART CATH AND CORONARY ANGIOGRAPHY N/A 02/20/2020   Procedure: LEFT HEART CATH AND CORONARY ANGIOGRAPHY;  Surgeon: Yvonne Kendall, MD;  Location: [facility];  Service: Cardiovascular;  Laterality: N/A;    Prior to Admission medications   Medication Sig Start Date End Date Taking? Authorizing Provider  Omega-3 Fatty Acids  (FISH OIL PO) Take 2,000 mg by mouth every evening.    Yes [provider]  OVER THE COUNTER MEDICATION Take 100 mg by mouth 2 (two) times daily. CBD   Yes [provider]  Probiotic Product (PROBIOTIC DAILY PO) Take 1 capsule by mouth at bedtime. 50 Billion   Yes [provider]  RABEprazole (ACIPHEX) 20 MG tablet Take 20 mg by mouth in the morning and at bedtime.  11/28/19  Yes [provider]  rosuvastatin (CRESTOR) 20 MG tablet Take 20 mg by mouth at bedtime. 12/03/21  Yes [provider]  Testosterone 10 MG/ACT (2%) GEL Apply 4 mg topically daily. Pump 04/23/18  Yes [provider]  tiZANidine (ZANAFLEX) 2 MG tablet Take 1-2 tablets (2-4 mg total) by mouth every 8 (eight) hours as needed for muscle spasms. 03/22/21  Yes Bing Neighbors, NP  TURMERIC PO Take 1 tablet by mouth daily.   Yes [provider]  valsartan-hydrochlorothiazide (DIOVAN-HCT) 80-12.5 MG per tablet Take 1 tablet by mouth at bedtime.    Yes [provider]  aspirin EC 81 MG tablet Take 1 tablet (81 mg total) by mouth daily. Swallow whole. 02/19/20   Jonelle Sidle, MD  b complex vitamins tablet Take 1 tablet by mouth daily.    [provider]  buPROPion (WELLBUTRIN XL) 150 MG 24 hr tablet Take 150 mg by mouth every morning. 08/16/20   [provider]  celecoxib (CELEBREX) 100  MG capsule Take 100 mg by mouth 2 (two) times daily.    [provider]  cetirizine (ZYRTEC) 10 MG tablet Take 10 mg by mouth at bedtime.     [provider]  desvenlafaxine (PRISTIQ) 50 MG 24 hr tablet Take 50 mg by mouth every evening.     [provider]  glipiZIDE (GLUCOTROL XL) 10 MG 24 hr tablet Take 10 mg by mouth daily with breakfast.    [provider]  GLUCOS-CHONDROIT-MSM-C-HYAL PO Take 2 tablets by mouth at bedtime.    [provider]  mometasone (NASONEX) 50 MCG/ACT nasal spray Place 2 sprays into the nose at  bedtime.     [provider]  Multiple Vitamin (MULITIVITAMIN WITH MINERALS) TABS Take 1 tablet by mouth at bedtime.     [provider]  SYNJARDY 12.5-500 MG TABS Take 1 tablet by mouth in the morning and at bedtime. 02/23/20   End, Cristal Deer, MD  tadalafil (CIALIS) 20 MG tablet Take 20 mg by mouth daily as needed for erectile dysfunction.    [provider]  tirzepatide Greggory Keen) 12.5 MG/0.5ML Pen Inject 12.5 mg into the skin once a week. 02/15/23     tirzepatide (MOUNJARO) 7.5 MG/0.5ML Pen Inject 7.5 mg into the skin once a week.    [provider]    Allergies as of 03/08/2023   (No Known Allergies)    Family History  Problem Relation Age of Onset   Diabetes Other    Other Daughter        had subtotal colectomy for severe constipation   Colon cancer Neg Hx    Liver disease Neg Hx     Social History   Socioeconomic History   Marital status: Married    Spouse name: Candace    Number of children: Not on file   Years of education: Not on file   Highest education level: Bachelor's degree (e.g., BA, AB, BS)  Occupational History   Occupation: Programmer, multimedia  Tobacco Use   Smoking status: Former    Current packs/day: 0.00    Average packs/day: 2.0 packs/day for 29.2 years (58.3 ttl pk-yrs)    Types: Cigarettes    Start date: 08/24/1977    Quit date: 10/20/2006    Years since quitting: 16.5   Smokeless tobacco: Never  Substance and Sexual Activity   Alcohol use: Yes    Alcohol/week: 0.0 standard drinks of alcohol    Comment: rare   Drug use: Never   Sexual activity: Yes  Other Topics Concern   Not on file  Social History Narrative   2 cups of coffee per day   BS degree   Social Determinants of Health   Financial Resource Strain: Not on file  Food Insecurity: Not on file  Transportation Needs: Not on file  Physical Activity: Not on file  Stress: Not on file  Social Connections: Not on file  Intimate Partner Violence:  Not on file    Review of Systems: See HPI, otherwise negative ROS  Physical Exam: BP 131/78   Pulse 79   Temp 98.6 F (37 C) (Oral)   Resp 18   SpO2 99%  General:   Alert,  Well-developed, well-nourished, pleasant and cooperative in NAD Lungs:  Clear throughout to auscultation.   No wheezes, crackles, or rhonchi. No acute distress. Heart:  Regular rate and rhythm; no murmurs, clicks, rubs,  or gallops. Abdomen:  Soft, nontender and nondistended. No masses, hepatosplenomegaly or hernias noted. Normal bowel  sounds, without guarding, and without rebound.    Impression/Plan: [name] is now here to undergo a screening colonoscopy.   average risk examination.  Risks, benefits, limitations, imponderables and alternatives regarding colonoscopy have been reviewed with the patient. Questions have been answered. All parties agreeable.     Notice:  This dictation was prepared with Dragon dictation along with smaller phrase technology. Any transcriptional errors that result from this process are unintentional and may not be corrected upon review.

## 2023-04-18 NOTE — Anesthesia Procedure Notes (Signed)
Date/Time: 04/18/2023 1:51 PM  Performed by: Julian Reil, CRNAPre-anesthesia Checklist: Patient identified, Emergency Drugs available, Suction available and Patient being monitored Patient Re-evaluated:Patient Re-evaluated prior to induction Oxygen Delivery Method: Nasal cannula Induction Type: IV induction Placement Confirmation: positive ETCO2 Comments: Optiflow High Flow Long O2 used due to H/O OSA, CPAP

## 2023-04-18 NOTE — Discharge Instructions (Signed)
  Colonoscopy Discharge Instructions  Read the instructions outlined below and refer to this sheet in the next few weeks. These discharge instructions provide you with general information on caring for yourself after you leave the hospital. Your doctor may also give you specific instructions. While your treatment has been planned according to the most current medical practices available, unavoidable complications occasionally occur. If you have any problems or questions after discharge, call Dr. Jena Gauss at [phone]. ACTIVITY You may resume your regular activity, but move at a slower pace for the next 24 hours.  Take frequent rest periods for the next 24 hours.  Walking will help get rid of the air and reduce the bloated feeling in your belly (abdomen).  No driving for 24 hours (because of the medicine (anesthesia) used during the test).   Do not sign any important legal documents or operate any machinery for 24 hours (because of the anesthesia used during the test).  NUTRITION Drink plenty of fluids.  You may resume your normal diet as instructed by your doctor.  Begin with a light meal and progress to your normal diet. Heavy or fried foods are harder to digest and may make you feel sick to your stomach (nauseated).  Avoid alcoholic beverages for 24 hours or as instructed.  MEDICATIONS You may resume your normal medications unless your doctor tells you otherwise.  WHAT YOU CAN EXPECT TODAY Some feelings of bloating in the abdomen.  Passage of more gas than usual.  Spotting of blood in your stool or on the toilet paper.  IF YOU HAD POLYPS REMOVED DURING THE COLONOSCOPY: No aspirin products for 7 days or as instructed.  No alcohol for 7 days or as instructed.  Eat a soft diet for the next 24 hours.  FINDING OUT THE RESULTS OF YOUR TEST Not all test results are available during your visit. If your test results are not back during the visit, make an appointment with your caregiver to find out the  results. Do not assume everything is normal if you have not heard from your caregiver or the medical facility. It is important for you to follow up on all of your test results.  SEEK IMMEDIATE MEDICAL ATTENTION IF: You have more than a spotting of blood in your stool.  Your belly is swollen (abdominal distention).  You are nauseated or vomiting.  You have a temperature over 101.  You have abdominal pain or discomfort that is severe or gets worse throughout the day.       1 polyp removed in your colon today.  Colon polyp and diverticulosis information provided   further recommendations to follow pending review of pathology report   at patient request, I called Araceli Bouche at [phone] to voicemail.  Left a message.

## 2023-04-20 NOTE — Anesthesia Postprocedure Evaluation (Signed)
Anesthesia Post Note  Patient: Derek Marsh  Procedure(s) Performed: COLONOSCOPY WITH PROPOFOL POLYPECTOMY INTESTINAL  Patient location during evaluation: Phase II Anesthesia Type: General Level of consciousness: awake Pain management: pain level controlled Vital Signs Assessment: post-procedure vital signs reviewed and stable Respiratory status: spontaneous breathing and respiratory function stable Cardiovascular status: blood pressure returned to baseline and stable Postop Assessment: no headache and no apparent nausea or vomiting Anesthetic complications: no Comments: Late entry   No notable events documented.   Last Vitals:  Vitals:   04/18/23 1420 04/18/23 1423  BP: 104/62 102/61  Pulse: 71   Resp: 14   Temp: 36.6 C   SpO2: 97%     Last Pain:  Vitals:   04/19/23 1437  TempSrc:   PainSc: 0-No pain                 Windell Norfolk

## 2023-04-24 ENCOUNTER — Encounter (HOSPITAL_COMMUNITY): Payer: Self-pay | Admitting: Internal Medicine

## 2023-04-26 LAB — SURGICAL PATHOLOGY

## 2023-04-30 ENCOUNTER — Encounter: Payer: Self-pay | Admitting: Internal Medicine
